# Patient Record
Sex: Female | Born: 1970 | Race: Black or African American | Hispanic: No | Marital: Married | Smoking: Never smoker
Health system: Southern US, Community
[De-identification: ages and names within clinical notes are randomized; demographics above are authoritative.]

## PROBLEM LIST (undated history)

## (undated) DIAGNOSIS — K219 Gastro-esophageal reflux disease without esophagitis: Secondary | ICD-10-CM

## (undated) DIAGNOSIS — I10 Essential (primary) hypertension: Secondary | ICD-10-CM

## (undated) HISTORY — PX: CERVICAL SPINE SURGERY: SHX589

---

## 2014-03-06 ENCOUNTER — Other Ambulatory Visit (HOSPITAL_BASED_OUTPATIENT_CLINIC_OR_DEPARTMENT_OTHER): Payer: Self-pay | Admitting: Emergency Medicine

## 2014-03-06 DIAGNOSIS — M5412 Radiculopathy, cervical region: Secondary | ICD-10-CM

## 2014-03-10 ENCOUNTER — Ambulatory Visit (HOSPITAL_BASED_OUTPATIENT_CLINIC_OR_DEPARTMENT_OTHER)
Admission: RE | Admit: 2014-03-10 | Discharge: 2014-03-10 | Disposition: A | Discharge status: Home / Self Care | Payer: 59 | Source: Ambulatory Visit | Attending: Emergency Medicine | Admitting: Emergency Medicine

## 2014-03-10 DIAGNOSIS — M542 Cervicalgia: Secondary | ICD-10-CM | POA: Diagnosis present

## 2014-03-10 DIAGNOSIS — M4802 Spinal stenosis, cervical region: Secondary | ICD-10-CM | POA: Diagnosis not present

## 2014-03-10 DIAGNOSIS — M5412 Radiculopathy, cervical region: Secondary | ICD-10-CM

## 2014-03-10 DIAGNOSIS — M5030 Other cervical disc degeneration, unspecified cervical region: Secondary | ICD-10-CM | POA: Insufficient documentation

## 2014-04-08 ENCOUNTER — Emergency Department (HOSPITAL_BASED_OUTPATIENT_CLINIC_OR_DEPARTMENT_OTHER)
Admission: EM | Admit: 2014-04-08 | Discharge: 2014-04-08 | Disposition: A | Discharge status: Home / Self Care | Payer: 59 | Attending: Emergency Medicine | Admitting: Emergency Medicine

## 2014-04-08 ENCOUNTER — Encounter (HOSPITAL_BASED_OUTPATIENT_CLINIC_OR_DEPARTMENT_OTHER): Payer: Self-pay | Admitting: Emergency Medicine

## 2014-04-08 DIAGNOSIS — R51 Headache: Secondary | ICD-10-CM | POA: Insufficient documentation

## 2014-04-08 DIAGNOSIS — J029 Acute pharyngitis, unspecified: Secondary | ICD-10-CM | POA: Diagnosis present

## 2014-04-08 DIAGNOSIS — Z791 Long term (current) use of non-steroidal anti-inflammatories (NSAID): Secondary | ICD-10-CM | POA: Diagnosis not present

## 2014-04-08 DIAGNOSIS — J039 Acute tonsillitis, unspecified: Secondary | ICD-10-CM | POA: Diagnosis not present

## 2014-04-08 DIAGNOSIS — Z79899 Other long term (current) drug therapy: Secondary | ICD-10-CM | POA: Insufficient documentation

## 2014-04-08 LAB — RAPID STREP SCREEN (MED CTR MEBANE ONLY): STREPTOCOCCUS, GROUP A SCREEN (DIRECT): NEGATIVE

## 2014-04-08 MED ORDER — DEXAMETHASONE 4 MG PO TABS
6.0000 mg | ORAL_TABLET | Freq: Once | ORAL | Status: AC
  Administered 2014-04-08: 6 mg via ORAL

## 2014-04-08 MED ORDER — DEXAMETHASONE 4 MG PO TABS
ORAL_TABLET | ORAL | Status: AC
  Administered 2014-04-08: 6 mg via ORAL
  Filled 2014-04-08: qty 2

## 2014-04-08 MED ORDER — PENICILLIN V POTASSIUM 500 MG PO TABS: 500.0000 mg | ORAL_TABLET | Freq: Two times a day (BID) | ORAL | Status: AC

## 2014-04-08 NOTE — ED Notes (Signed)
Patient here with 1 week of sore throat and bilateral ear pain, patient had 2 days of amoxicillin left and took those with some relief now has fever and increased sore throat

## 2014-04-08 NOTE — ED Notes (Signed)
Rechecked pt b/p 155/114 

## 2014-04-08 NOTE — ED Provider Notes (Signed)
CSN: [id number]     Arrival date & time 04/08/14  1007 History   First MD Initiated Contact with Patient 04/08/14 1048     Chief Complaint  Patient presents with  . Sore Throat     (Consider location/radiation/quality/duration/timing/severity/associated sxs/prior Treatment) Patient is a 43 y.o. female presenting with pharyngitis. The history is provided by the patient and the spouse. No language interpreter was used.  Sore Throat This is a new problem. The current episode started 2 days ago. The problem occurs constantly. The problem has been gradually worsening. Associated symptoms include headaches. Pertinent negatives include no chest pain, no abdominal pain and no shortness of breath. Associated symptoms comments: Fever, chills, L ear pain, anorexia, nausea. The symptoms are aggravated by swallowing. Nothing relieves the symptoms. Treatments tried: 2 days f amoxicillin. The treatment provided mild relief.    No past medical history on file. No past surgical history on file. No family history on file. History  Substance Use Topics  . Smoking status: Not on file  . Smokeless tobacco: Not on file  . Alcohol Use: Not on file   OB History   No data available     Review of Systems  Constitutional: Negative for fever, chills, diaphoresis, activity change, appetite change and fatigue.  HENT: Positive for sore throat. Negative for congestion, facial swelling and rhinorrhea.   Eyes: Negative for photophobia and discharge.  Respiratory: Negative for cough, chest tightness and shortness of breath.   Cardiovascular: Negative for chest pain, palpitations and leg swelling.  Gastrointestinal: Negative for nausea, vomiting, abdominal pain and diarrhea.  Endocrine: Negative for polydipsia and polyuria.  Genitourinary: Negative for dysuria, frequency, difficulty urinating and pelvic pain.  Musculoskeletal: Negative for arthralgias, back pain, neck pain and neck stiffness.  Skin: Negative for  color change and wound.  Allergic/Immunologic: Negative for immunocompromised state.  Neurological: Positive for headaches. Negative for facial asymmetry, weakness and numbness.  Hematological: Does not bruise/bleed easily.  Psychiatric/Behavioral: Negative for confusion and agitation.      Allergies  Review of patient's allergies indicates no known allergies.  Home Medications   Prior to Admission medications   Medication Sig Start Date End Date Taking? Authorizing Provider  labetalol (NORMODYNE) 100 MG tablet Take 100 mg by mouth 2 (two) times daily.   Yes Historical Provider, MD  lisinopril (PRINIVIL,ZESTRIL) 20 MG tablet Take 20 mg by mouth daily.   Yes Historical Provider, MD  meloxicam (MOBIC) 7.5 MG tablet Take 15 mg by mouth daily.   Yes Historical Provider, MD  penicillin v potassium (VEETID) 500 MG tablet Take 1 tablet (500 mg total) by mouth 2 (two) times daily. For 10 days 04/08/14 04/15/14  Toy Cookey, MD   BP 155/113  Pulse 80  Temp(Src) 99.2 F (37.3 C) (Oral)  Resp 20  SpO2 100% Physical Exam  Constitutional: She is oriented to person, place, and time. She appears well-developed and well-nourished. No distress.  HENT:  Head: Normocephalic and atraumatic.  Mouth/Throat: No oropharyngeal exudate or tonsillar abscesses.  Bilateral tonsillar enlargement and exudate,  Eyes: Pupils are equal, round, and reactive to light.  Neck: Normal range of motion. Neck supple.  Tender anterior cervical lymphadenopathy  Cardiovascular: Normal rate, regular rhythm and normal heart sounds.  Exam reveals no gallop and no friction rub.   No murmur heard. Pulmonary/Chest: Effort normal and breath sounds normal. No respiratory distress. She has no wheezes. She has no rales.  Abdominal: Soft. Bowel sounds are normal. She exhibits no distension and  no mass. There is no tenderness. There is no rebound and no guarding.  Musculoskeletal: Normal range of motion. She exhibits no edema and no  tenderness.  Neurological: She is alert and oriented to person, place, and time.  Skin: Skin is warm and dry.  Psychiatric: She has a normal mood and affect.    ED Course  Procedures (including critical care time) Labs Review Labs Reviewed  RAPID STREP SCREEN  CULTURE, GROUP A STREP    Imaging Review No results found.   EKG Interpretation None      MDM   Final diagnoses:  Tonsillitis with exudate    Pt is a 43 y.o. female with Pmhx as above who presents with  several days of sore throat, fevers, chills cold left ear pain, anorexia, and mild nausea. The patient reports a positive sick contact. She states she took several left over amoxicillin which seemed to improve her symptoms for a couple days. She states the left-sided return is worse than the right. On physical exam: Patient is in no acute distress. She has bilateral tonsillar edema with bilateral tonsillar exudates. No signs of PTA or retropharyngeal abscess. The pulmonary exam is normal. TMs are clear. Using center criteria she receives points for tonsillar exudates, fever, lack of cough, and tender anterior cervical lymphadenopathy. We'll treat her with 10 days of Pen-Vee K. Otherwise recommend supportive care. Return precautions given for new or worsening symptoms including worsening pain, worsening swelling, inability to tolerate by mouth.        Toy Cookey, MD 04/08/14 [phone]

## 2014-04-08 NOTE — Discharge Instructions (Signed)

## 2014-04-10 LAB — CULTURE, GROUP A STREP

## 2014-05-21 ENCOUNTER — Emergency Department (HOSPITAL_BASED_OUTPATIENT_CLINIC_OR_DEPARTMENT_OTHER)
Admission: EM | Admit: 2014-05-21 | Discharge: 2014-05-22 | Disposition: A | Discharge status: Home / Self Care | Payer: 59 | Attending: Emergency Medicine | Admitting: Emergency Medicine

## 2014-05-21 ENCOUNTER — Encounter (HOSPITAL_BASED_OUTPATIENT_CLINIC_OR_DEPARTMENT_OTHER): Payer: Self-pay | Admitting: Emergency Medicine

## 2014-05-21 ENCOUNTER — Emergency Department (HOSPITAL_BASED_OUTPATIENT_CLINIC_OR_DEPARTMENT_OTHER): Payer: 59

## 2014-05-21 DIAGNOSIS — K219 Gastro-esophageal reflux disease without esophagitis: Secondary | ICD-10-CM | POA: Insufficient documentation

## 2014-05-21 DIAGNOSIS — O99611 Diseases of the digestive system complicating pregnancy, first trimester: Secondary | ICD-10-CM | POA: Insufficient documentation

## 2014-05-21 DIAGNOSIS — O10011 Pre-existing essential hypertension complicating pregnancy, first trimester: Secondary | ICD-10-CM | POA: Diagnosis not present

## 2014-05-21 DIAGNOSIS — O039 Complete or unspecified spontaneous abortion without complication: Secondary | ICD-10-CM | POA: Insufficient documentation

## 2014-05-21 DIAGNOSIS — O209 Hemorrhage in early pregnancy, unspecified: Secondary | ICD-10-CM

## 2014-05-21 DIAGNOSIS — Z791 Long term (current) use of non-steroidal anti-inflammatories (NSAID): Secondary | ICD-10-CM | POA: Diagnosis not present

## 2014-05-21 DIAGNOSIS — Z3A01 Less than 8 weeks gestation of pregnancy: Secondary | ICD-10-CM | POA: Diagnosis not present

## 2014-05-21 DIAGNOSIS — O9989 Other specified diseases and conditions complicating pregnancy, childbirth and the puerperium: Secondary | ICD-10-CM | POA: Diagnosis present

## 2014-05-21 DIAGNOSIS — O26851 Spotting complicating pregnancy, first trimester: Secondary | ICD-10-CM

## 2014-05-21 DIAGNOSIS — Z79899 Other long term (current) drug therapy: Secondary | ICD-10-CM | POA: Insufficient documentation

## 2014-05-21 HISTORY — DX: Essential (primary) hypertension: I10

## 2014-05-21 HISTORY — DX: Gastro-esophageal reflux disease without esophagitis: K21.9

## 2014-05-21 LAB — CBC WITH DIFFERENTIAL/PLATELET
Basophils Absolute: 0 10*3/uL (ref 0.0–0.1)
Basophils Relative: 0 % (ref 0–1)
EOS PCT: 1 % (ref 0–5)
Eosinophils Absolute: 0.1 10*3/uL (ref 0.0–0.7)
HEMATOCRIT: 35.2 % — AB (ref 36.0–46.0)
HEMOGLOBIN: 12 g/dL (ref 12.0–15.0)
LYMPHS ABS: 2.2 10*3/uL (ref 0.7–4.0)
LYMPHS PCT: 31 % (ref 12–46)
MCH: 27.9 pg (ref 26.0–34.0)
MCHC: 34.1 g/dL (ref 30.0–36.0)
MCV: 81.9 fL (ref 78.0–100.0)
MONO ABS: 1 10*3/uL (ref 0.1–1.0)
Monocytes Relative: 14 % — ABNORMAL HIGH (ref 3–12)
NEUTROS ABS: 3.8 10*3/uL (ref 1.7–7.7)
Neutrophils Relative %: 54 % (ref 43–77)
Platelets: 233 10*3/uL (ref 150–400)
RBC: 4.3 MIL/uL (ref 3.87–5.11)
RDW: 15.8 % — ABNORMAL HIGH (ref 11.5–15.5)
WBC: 7.1 10*3/uL (ref 4.0–10.5)

## 2014-05-21 LAB — WET PREP, GENITAL
TRICH WET PREP: NONE SEEN
YEAST WET PREP: NONE SEEN

## 2014-05-21 LAB — BASIC METABOLIC PANEL
Anion gap: 12 (ref 5–15)
BUN: 13 mg/dL (ref 6–23)
CO2: 27 meq/L (ref 19–32)
CREATININE: 0.9 mg/dL (ref 0.50–1.10)
Calcium: 9.5 mg/dL (ref 8.4–10.5)
Chloride: 101 mEq/L (ref 96–112)
GFR calc Af Amer: 89 mL/min — ABNORMAL LOW (ref >90)
GFR calc non Af Amer: 77 mL/min — ABNORMAL LOW (ref >90)
GLUCOSE: 97 mg/dL (ref 70–99)
Potassium: 4.1 mEq/L (ref 3.7–5.3)
Sodium: 140 mEq/L (ref 137–147)

## 2014-05-21 LAB — URINALYSIS, ROUTINE W REFLEX MICROSCOPIC
BILIRUBIN URINE: NEGATIVE
Glucose, UA: NEGATIVE mg/dL
KETONES UR: 15 mg/dL — AB
Nitrite: NEGATIVE
PH: 5 (ref 5.0–8.0)
Protein, ur: NEGATIVE mg/dL
Specific Gravity, Urine: 1.024 (ref 1.005–1.030)
UROBILINOGEN UA: 0.2 mg/dL (ref 0.0–1.0)

## 2014-05-21 LAB — PREGNANCY, URINE: Preg Test, Ur: POSITIVE — AB

## 2014-05-21 LAB — URINE MICROSCOPIC-ADD ON

## 2014-05-21 LAB — HCG, QUANTITATIVE, PREGNANCY: hCG, Beta Chain, Quant, S: 7265 m[IU]/mL — ABNORMAL HIGH (ref <5)

## 2014-05-21 NOTE — ED Notes (Signed)
Pt is [redacted] weeks pregnant and began spotting last week. She began having lower abdominal pains yesterday that resolved and then returned. This is pt's 4th pregnancy.

## 2014-05-21 NOTE — ED Notes (Signed)
Patient transported to CT 

## 2014-05-21 NOTE — ED Notes (Signed)
np at bedside

## 2014-05-21 NOTE — ED Provider Notes (Signed)
CSN: [id number]     Arrival date & time 05/21/14  2043 History   First MD Initiated Contact with Patient 05/21/14 2118     Chief Complaint  Patient presents with  . Abdominal Pain     (Consider location/radiation/quality/duration/timing/severity/associated sxs/prior Treatment) HPI Comments: Pt comes in today with lower abdominal cramping and bleeding. Pt states that she is [redacted] weeks pregnant. Pt state that she G4p2 with one elective abortion. Pt has been seen by ob but they told her they didn't see a heart beat but that she was going to have a repeat us at 8 week. She states that she has had brown discharge for a week and today it turned red with it progressively getting heavier. Unsure of blood type  The history is provided by the patient. No language interpreter was used.    Past Medical History  Diagnosis Date  . Hypertension   . GERD (gastroesophageal reflux disease)    History reviewed. No pertinent past surgical history. No family history on file. History  Substance Use Topics  . Smoking status: Never Smoker   . Smokeless tobacco: Not on file  . Alcohol Use: Yes   OB History   Grav Para Term Preterm Abortions TAB SAB Ect Mult Living   1              Review of Systems  All other systems reviewed and are negative.     Allergies  Review of patient's allergies indicates no known allergies.  Home Medications   Prior to Admission medications   Medication Sig Start Date End Date Taking? Authorizing Provider  omeprazole (PRILOSEC) 40 MG capsule Take 40 mg by mouth daily.   Yes Historical Provider, MD  labetalol (NORMODYNE) 100 MG tablet Take 100 mg by mouth 2 (two) times daily.    Historical Provider, MD  lisinopril (PRINIVIL,ZESTRIL) 20 MG tablet Take 20 mg by mouth daily.    Historical Provider, MD  meloxicam (MOBIC) 7.5 MG tablet Take 15 mg by mouth daily.    Historical Provider, MD   BP 130/90  Pulse 110  Temp(Src) 98.6 F (37 C) (Oral)  Resp 18  Ht 5\' 3"  (1.6  m)  Wt 155 lb (70.308 kg)  BMI 27.46 kg/m2  SpO2 100% Physical Exam  Nursing note and vitals reviewed. Constitutional: She is oriented to person, place, and time. She appears well-developed and well-nourished.  Cardiovascular: Normal rate and regular rhythm.   Pulmonary/Chest: Effort normal and breath sounds normal.  Abdominal: Soft. Bowel sounds are normal.  Generalized lower abdominal tenderness  Genitourinary:  Brown blood noted in vaginal vault  Musculoskeletal: Normal range of motion.  Neurological: She is alert and oriented to person, place, and time.  Skin: Skin is warm and dry.    ED Course  Procedures (including critical care time) Labs Review Labs Reviewed  WET PREP, GENITAL - Abnormal; Notable for the following:    Clue Cells Wet Prep HPF POC FEW (*)    WBC, Wet Prep HPF POC FEW (*)    All other components within normal limits  URINALYSIS, ROUTINE W REFLEX MICROSCOPIC - Abnormal; Notable for the following:    APPearance CLOUDY (*)    Hgb urine dipstick LARGE (*)    Ketones, ur 15 (*)    Leukocytes, UA SMALL (*)    All other components within normal limits  PREGNANCY, URINE - Abnormal; Notable for the following:    Preg Test, Ur POSITIVE (*)    All other components  within normal limits  URINE MICROSCOPIC-ADD ON - Abnormal; Notable for the following:    Squamous Epithelial / LPF FEW (*)    Bacteria, UA FEW (*)    All other components within normal limits  CBC WITH DIFFERENTIAL - Abnormal; Notable for the following:    HCT 35.2 (*)    RDW 15.8 (*)    Monocytes Relative 14 (*)    All other components within normal limits  BASIC METABOLIC PANEL - Abnormal; Notable for the following:    GFR calc non Af Amer 77 (*)    GFR calc Af Amer 89 (*)    All other components within normal limits  HCG, QUANTITATIVE, PREGNANCY - Abnormal; Notable for the following:    hCG, Beta Chain, Quant, S 7265 (*)    All other components within normal limits  GC/CHLAMYDIA PROBE AMP   ABO/RH    Imaging Review [place] Ob Comp Less 14 Wks  05/21/2014   CLINICAL DATA:  43 year old female with bleeding in early pregnancy. Initial encounter. Quantitative beta HCG 7,265. Estimated gestational age by LMP 8 weeks 6 days.  EXAM: OBSTETRIC <14 WK US AND TRANSVAGINAL OB US  TECHNIQUE: Both transabdominal and transvaginal ultrasound examinations were performed for complete evaluation of the gestation as well as the maternal uterus, adnexal regions, and pelvic cul-de-sac. Transvaginal technique was performed to assess early pregnancy.  COMPARISON:  None.  FINDINGS: Intrauterine gestational sac: Single  Yolk sac:  Visible  Embryo:  Visible  Cardiac Activity: Not detected  MSD:  1.98  mm   6 w   6  d  Maternal uterus/adnexae: No subchorionic hemorrhage or pelvic free fluid. Normal right ovary 2.9 x 1.6 x 2.8 cm. Slightly larger left ovary measuring 2.9 x 2.3 cm, may contain a corpus luteum cyst (image 35).  IMPRESSION: Findings are suspicious but not yet definitive for failed pregnancy. Recommend follow-up US in 10-14 days for definitive diagnosis. This recommendation follows SRU consensus guidelines: Diagnostic Criteria for Nonviable Pregnancy Early in the First Trimester. Malva Limes Engl J Med 2013; 409:8119-14; 369:1443-51.   Electronically Signed   By: Augusto GambleLee  Hall M.D.   On: 05/21/2014 23:07   [place] Ob Transvaginal  05/21/2014   CLINICAL DATA:  43 year old female with bleeding in early pregnancy. Initial encounter. Quantitative beta HCG 7,265. Estimated gestational age by LMP 8 weeks 6 days.  EXAM: OBSTETRIC <14 WK US AND TRANSVAGINAL OB US  TECHNIQUE: Both transabdominal and transvaginal ultrasound examinations were performed for complete evaluation of the gestation as well as the maternal uterus, adnexal regions, and pelvic cul-de-sac. Transvaginal technique was performed to assess early pregnancy.  COMPARISON:  None.  FINDINGS: Intrauterine gestational sac: Single  Yolk sac:  Visible  Embryo:  Visible  Cardiac Activity: Not  detected  MSD:  1.98  mm   6 w   6  d  Maternal uterus/adnexae: No subchorionic hemorrhage or pelvic free fluid. Normal right ovary 2.9 x 1.6 x 2.8 cm. Slightly larger left ovary measuring 2.9 x 2.3 cm, may contain a corpus luteum cyst (image 35).  IMPRESSION: Findings are suspicious but not yet definitive for failed pregnancy. Recommend follow-up US in 10-14 days for definitive diagnosis. This recommendation follows SRU consensus guidelines: Diagnostic Criteria for Nonviable Pregnancy Early in the First Trimester. Malva Limes Engl J Med 2013; 782:9562-13; 369:1443-51.   Electronically Signed   By: Augusto GambleLee  Hall M.D.   On: 05/21/2014 23:07     EKG Interpretation None      MDM   Final diagnoses:  Spontaneous abortion    Discussed likely spontaneous abortion. Pt B+. Pt instructed on follow up with ob. Pt verbalized understanding    Teressa Lower, NP 05/22/14 [phone]

## 2014-05-22 LAB — ABO/RH: ABO/RH(D): B POS

## 2014-05-22 LAB — GC/CHLAMYDIA PROBE AMP
CT Probe RNA: NEGATIVE
GC Probe RNA: NEGATIVE

## 2014-05-22 NOTE — Discharge Instructions (Signed)
Follow up with your doctor as discussed to have a repeat ultrasound and blood work in the next week Miscarriage A miscarriage is the loss of an unborn baby (fetus) before the 20th week of pregnancy. The cause is often unknown.  HOME CARE  You may need to stay in bed (bed rest), or you may be able to do light activity. Go about activity as told by your doctor.  Have help at home.  Write down how many pads you use each day. Write down how soaked they are.  Do not use tampons. Do not wash out your vagina (douche) or have sex (intercourse) until your doctor approves.  Only take medicine as told by your doctor.  Do not take aspirin.  Keep all doctor visits as told.  If you or your partner have problems with grieving, talk to your doctor. You can also try counseling. Give yourself time to grieve before trying to get pregnant again. GET HELP RIGHT AWAY IF:  You have bad cramps or pain in your back or belly (abdomen).  You have a fever.  You pass large clumps of blood (clots) from your vagina that are walnut-sized or larger. Save the clumps for your doctor to see.  You pass large amounts of tissue from your vagina. Save the tissue for your doctor to see.  You have more bleeding.  You have thick, bad-smelling fluid (discharge) coming from the vagina.  You get lightheaded, weak, or you pass out (faint).  You have chills. MAKE SURE YOU:  Understand these instructions.  Will watch your condition.  Will get help right away if you are not doing well or get worse. Document Released: 10/12/2011 Document Reviewed: 10/12/2011 [facility] Patient Information 2015 [employer], [place]. This information is not intended to replace advice given to you by your health care provider. Make sure you discuss any questions you have with your health care provider.

## 2014-05-23 NOTE — ED Provider Notes (Signed)
Medical screening examination/treatment/procedure(s) were performed by non-physician practitioner and as supervising physician I was immediately available for consultation/collaboration.   EKG Interpretation None        Liviya Santini, MD 05/23/14 0013 

## 2014-06-04 ENCOUNTER — Encounter (HOSPITAL_BASED_OUTPATIENT_CLINIC_OR_DEPARTMENT_OTHER): Payer: Self-pay | Admitting: Emergency Medicine

## 2015-06-08 IMAGING — US US OB TRANSVAGINAL
1 series · 14 of 28 positions shown · non-contrast
Comparison: None.

CLINICAL DATA: 43-year-old female with bleeding in early pregnancy.
Initial encounter. Quantitative beta HCG [DATE]. Estimated
gestational age by LMP 8 weeks 6 days.

EXAM:
OBSTETRIC <14 WK US AND TRANSVAGINAL OB US
TECHNIQUE: Both transabdominal and transvaginal ultrasound examinations were
performed for complete evaluation of the gestation as well as the
maternal uterus, adnexal regions, and pelvic cul-de-sac.
Transvaginal technique was performed to assess early pregnancy.

[Series 1: us ob transvaginal · 0.22mm/px · 38 acquisitions, 14 frames shown]
[im 2/38]
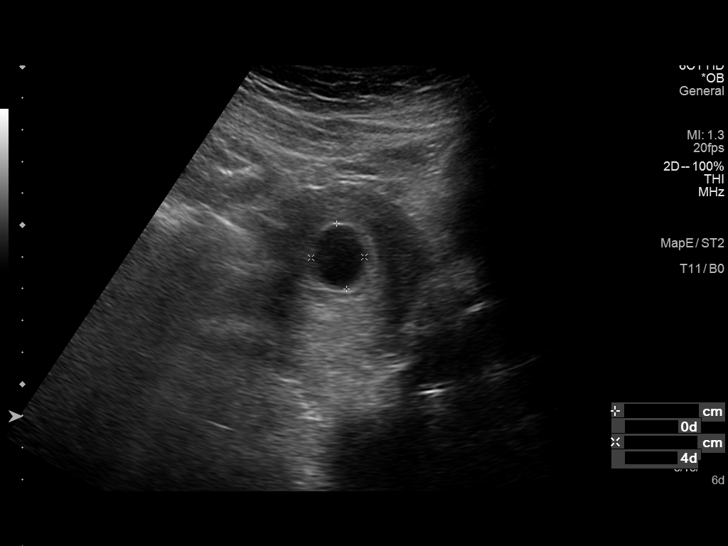
[im 5/38]
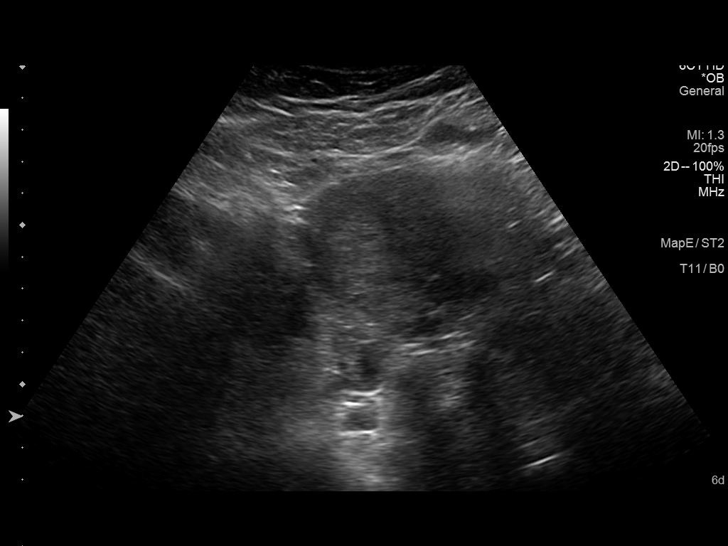
[im 7/38]
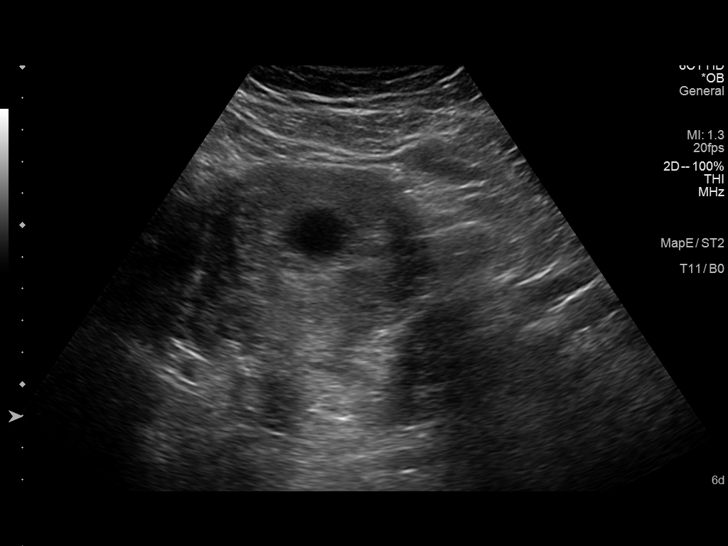
[im 10/38]
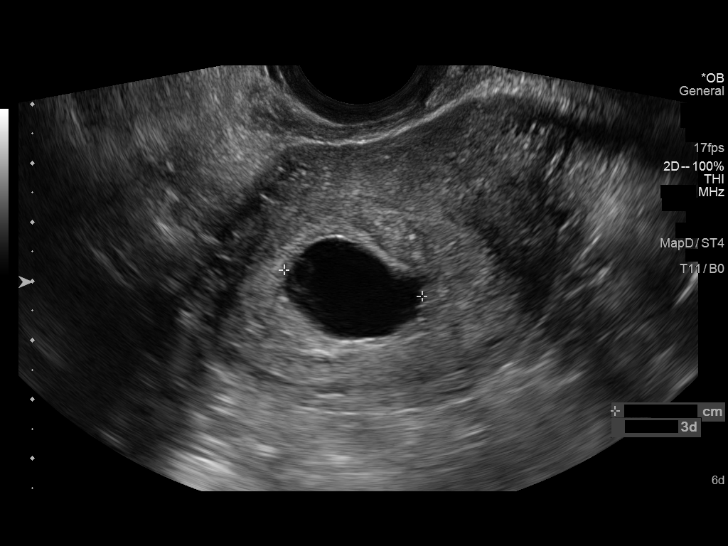
[im 13/38]
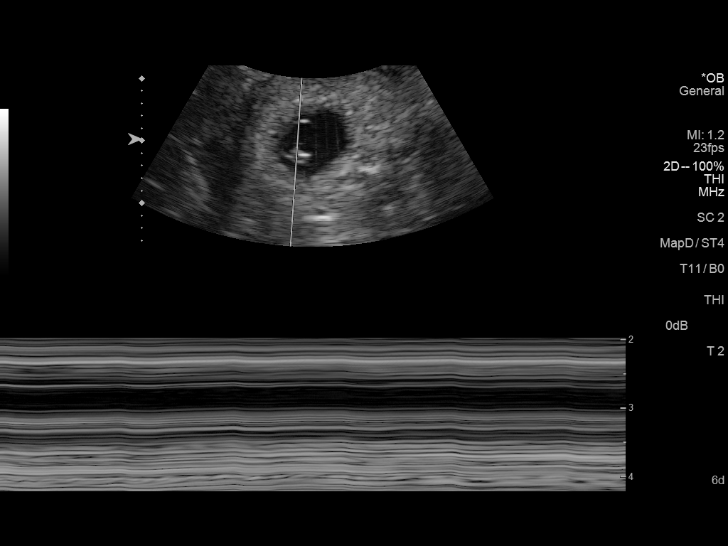
[im 16/38]
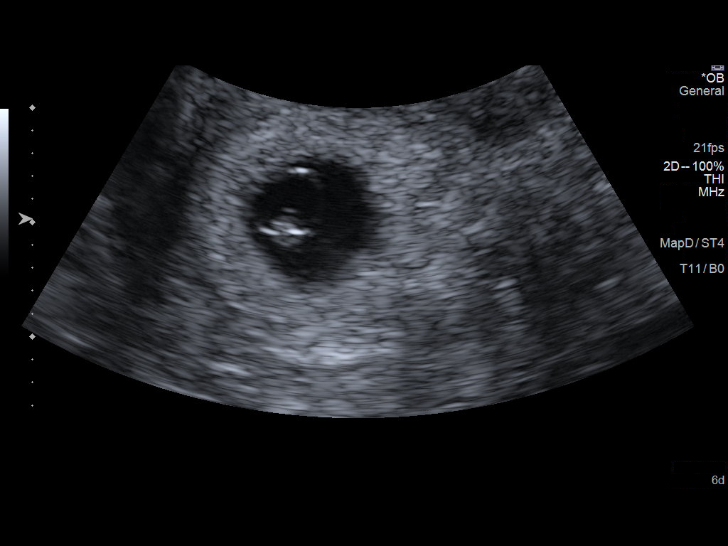
[im 18/38]
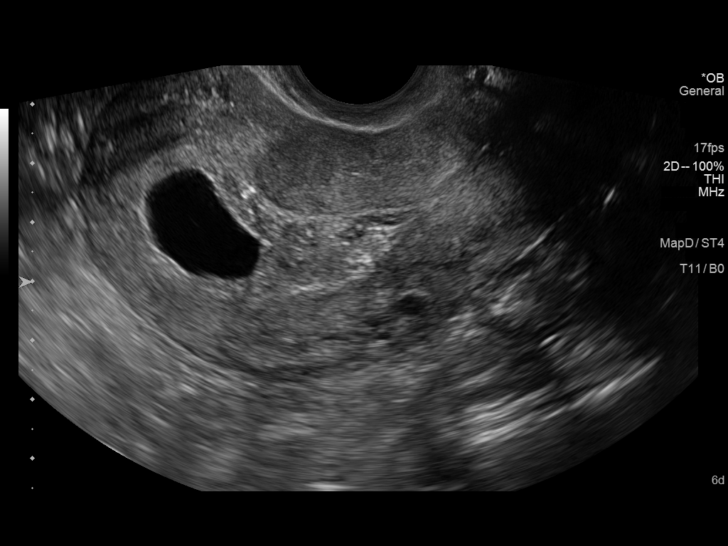
[im 21/38]
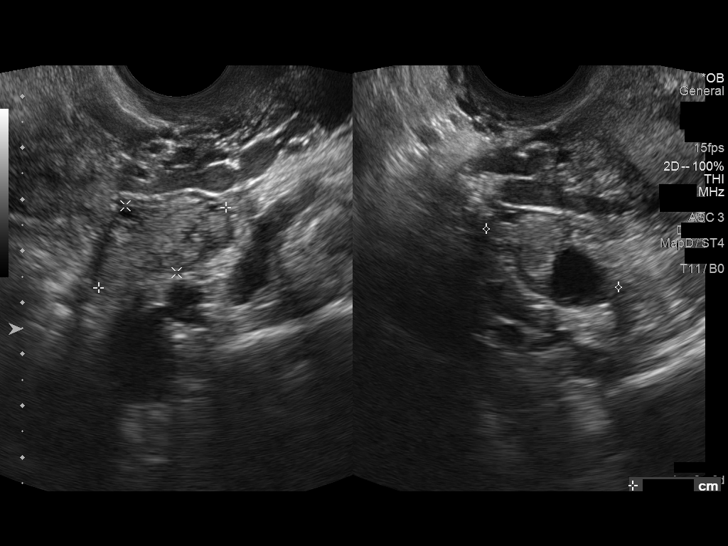
[im 24/38]
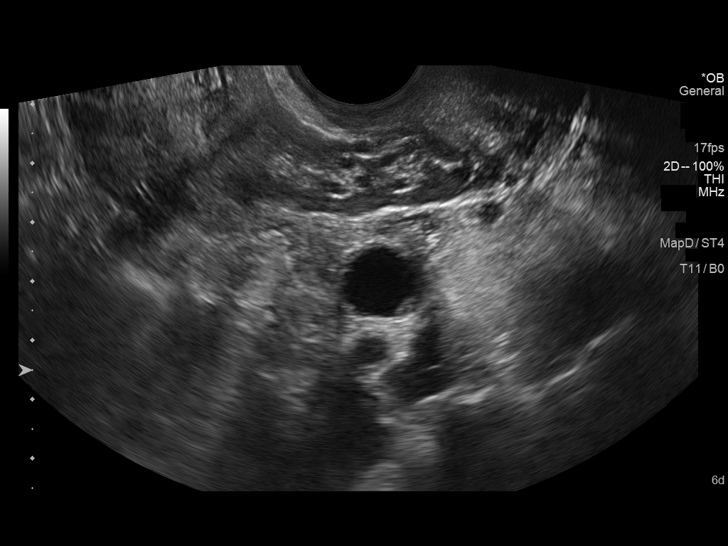
[im 27/38]
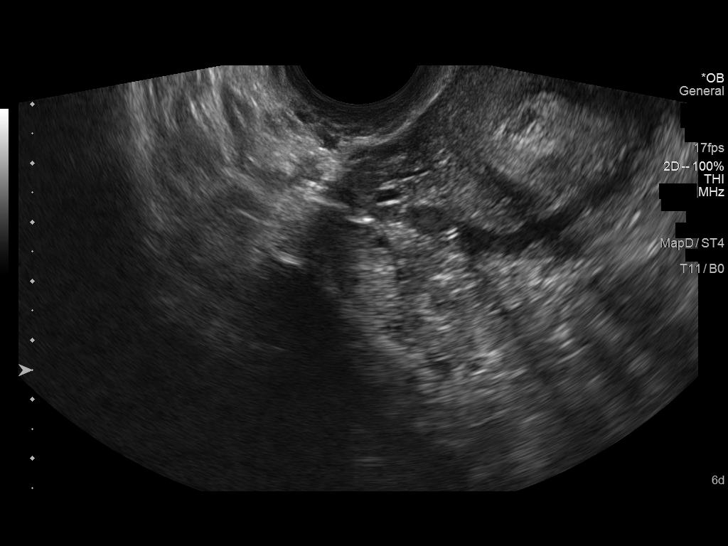
[im 29/38]
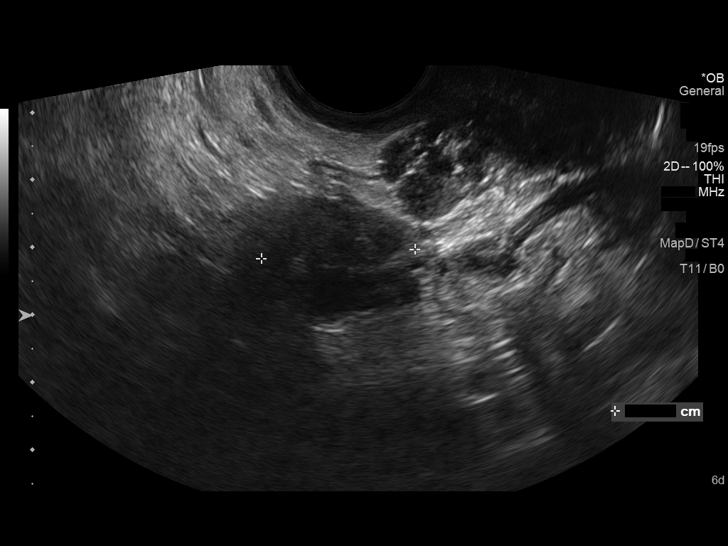
[im 32/38]
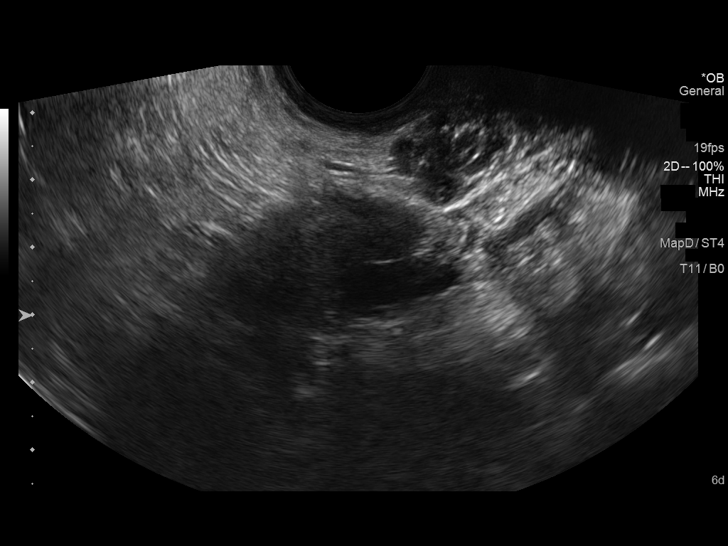
[im 35/38]
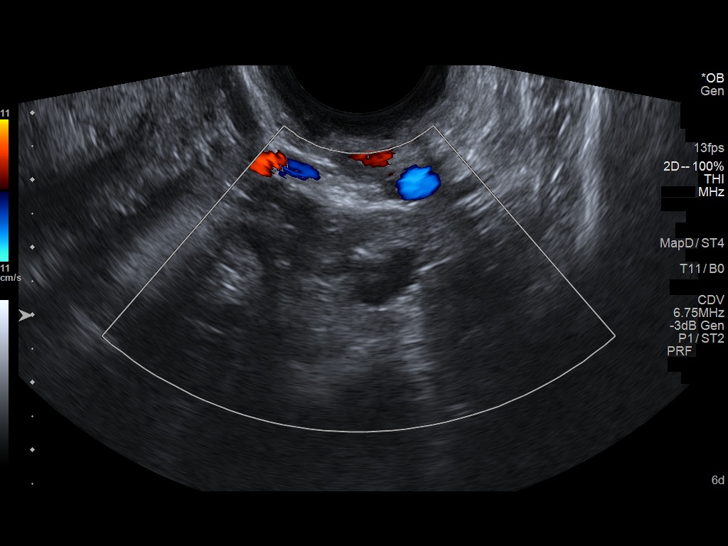
[im 38/38]
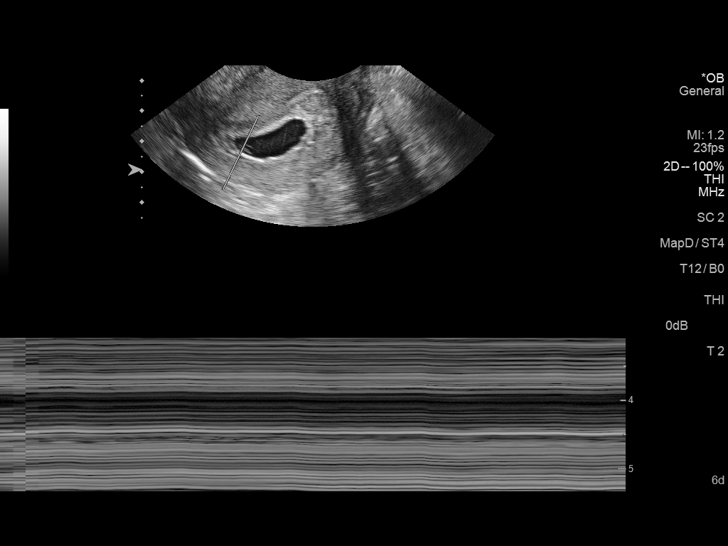

[14 of 28 positions shown; findings below may reference images not displayed]

FINDINGS: Intrauterine gestational sac: Single

Yolk sac:  Visible

Embryo:  Visible

Cardiac Activity: Not detected

MSD:  1.98  mm   6 w   6  d

Maternal uterus/adnexae: No subchorionic hemorrhage or pelvic free
fluid. Normal right ovary 2.9 x 1.6 x 2.8 cm. Slightly larger left
ovary measuring 2.9 x 2.3 cm, may contain a corpus luteum cyst
(image 35).
IMPRESSION: Findings are suspicious but not yet definitive for failed pregnancy.
Recommend follow-up US in 10-14 days for definitive diagnosis. This
recommendation follows SRU consensus guidelines: Diagnostic Criteria
for Nonviable Pregnancy Early in the First Trimester. N Engl J Med

## 2016-02-27 ENCOUNTER — Emergency Department (HOSPITAL_BASED_OUTPATIENT_CLINIC_OR_DEPARTMENT_OTHER)
Admission: EM | Admit: 2016-02-27 | Discharge: 2016-02-27 | Disposition: A | Discharge status: Home / Self Care | Attending: Emergency Medicine | Admitting: Emergency Medicine

## 2016-02-27 ENCOUNTER — Encounter (HOSPITAL_BASED_OUTPATIENT_CLINIC_OR_DEPARTMENT_OTHER): Payer: Self-pay | Admitting: *Deleted

## 2016-02-27 DIAGNOSIS — Z79899 Other long term (current) drug therapy: Secondary | ICD-10-CM | POA: Insufficient documentation

## 2016-02-27 DIAGNOSIS — I10 Essential (primary) hypertension: Secondary | ICD-10-CM | POA: Insufficient documentation

## 2016-02-27 DIAGNOSIS — J029 Acute pharyngitis, unspecified: Secondary | ICD-10-CM

## 2016-02-27 LAB — RAPID STREP SCREEN (MED CTR MEBANE ONLY): Streptococcus, Group A Screen (Direct): NEGATIVE

## 2016-02-27 MED ORDER — DEXAMETHASONE SODIUM PHOSPHATE 10 MG/ML IJ SOLN
10.0000 mg | Freq: Once | INTRAMUSCULAR | Status: AC
  Administered 2016-02-27: 10 mg via INTRAMUSCULAR
  Filled 2016-02-27: qty 1

## 2016-02-27 NOTE — Discharge Instructions (Signed)
You have been seen today for a sore throat and body aches. Your rapid strep test was negative, indicating strep throat is unlikely. Your symptoms are consistent with a viral illness. Viruses do not require antibiotics. Treatment is symptomatic care. Drink plenty of fluids and get plenty of rest. You should be drinking at least a liter of water an hour to stay hydrated. Ibuprofen, Naproxen, or Tylenol for pain or fever. Plain Mucinex may help relieve congestion. Warm liquids or Chloraseptic spray may help soothe the sore throat. Follow up with PCP as needed should symptoms fail to resolve. Return to ED should symptoms worsen.

## 2016-02-27 NOTE — ED Triage Notes (Signed)
Sore throat x 4 days. White drainage on her tonsils.

## 2016-02-27 NOTE — ED Provider Notes (Signed)
[facility]   CSN: [id number] Arrival date & time: 02/27/16  1234  First Provider Contact:  First MD Initiated Contact with Patient 02/27/16 1254        History   Chief Complaint Chief Complaint  Patient presents with  . Sore Throat    HPI Diamond Baker is a 45 y.o. female.  HPI   Diamond Baker is a 45 y.o. female, with a history of GERD and hypertension, presenting to the ED with Sore throat, body aches, and congestion for the last 4 days. Patient has been taking ibuprofen intermittently for symptoms. Throat pain is bilateral and moderate, sharp in nature, nonradiating. Patient denies fever/chills, difficulty breathing or swallowing, nausea or vomiting, or any other complaints.    Past Medical History:  Diagnosis Date  . GERD (gastroesophageal reflux disease)   . Hypertension     There are no active problems to display for this patient.   History reviewed. No pertinent surgical history.  OB History    Gravida Para Term Preterm AB Living   1             SAB TAB Ectopic Multiple Live Births                   Home Medications    Prior to Admission medications   Medication Sig Start Date End Date Taking? Authorizing Provider  labetalol (NORMODYNE) 100 MG tablet Take 100 mg by mouth 2 (two) times daily.    Historical Provider, MD  lisinopril (PRINIVIL,ZESTRIL) 20 MG tablet Take 20 mg by mouth daily.    Historical Provider, MD  meloxicam (MOBIC) 7.5 MG tablet Take 15 mg by mouth daily.    Historical Provider, MD  omeprazole (PRILOSEC) 40 MG capsule Take 40 mg by mouth daily.    Historical Provider, MD    Family History No family history on file.  Social History Social History  Substance Use Topics  . Smoking status: Never Smoker  . Smokeless tobacco: Never Used  . Alcohol use Yes     Allergies   Review of patient's allergies indicates no known allergies.   Review of Systems Review of Systems    Constitutional: Negative for chills and fever.  HENT: Positive for congestion and sore throat. Negative for drooling, facial swelling, trouble swallowing and voice change.   Respiratory: Negative for cough and shortness of breath.   Musculoskeletal: Positive for myalgias.  All other systems reviewed and are negative.    Physical Exam Updated Vital Signs BP (!) 145/112   Pulse 88   Temp 98.7 F (37.1 C) (Oral)   Resp 16   Ht 5\' 4"  (1.626 m)   Wt 70.3 kg   LMP 02/13/2016   SpO2 100%   Breastfeeding? Unknown   BMI 26.61 kg/m   Physical Exam  Constitutional: She appears well-developed and well-nourished. No distress.  HENT:  Head: Normocephalic and atraumatic.  Mouth/Throat: Mucous membranes are normal. Oropharyngeal exudate, posterior oropharyngeal edema and posterior oropharyngeal erythema present. No tonsillar abscesses.  Patient readily handles oral secretions without difficulty.  Eyes: Conjunctivae are normal.  Neck: Neck supple.  Cardiovascular: Normal rate, regular rhythm, normal heart sounds and intact distal pulses.   Pulmonary/Chest: Effort normal and breath sounds normal. No respiratory distress.  No increased work of breathing.  Abdominal: Soft. There is no tenderness. There is no guarding.  Musculoskeletal: She exhibits no edema or tenderness.  Lymphadenopathy:    She has cervical adenopathy.  Neurological:  She is alert.  Skin: Skin is warm and dry. She is not diaphoretic.  Psychiatric: She has a normal mood and affect. Her behavior is normal.  Nursing note and vitals reviewed.    ED Treatments / Results  Labs (all labs ordered are listed, but only abnormal results are displayed) Labs Reviewed  RAPID STREP SCREEN (NOT AT [facility])  CULTURE, GROUP A STREP [facility])    EKG  EKG Interpretation None       Radiology No results found.  Procedures Procedures (including critical care time)  Medications Ordered in ED Medications  dexamethasone (DECADRON)  injection 10 mg (10 mg Intramuscular Given 02/27/16 1328)     Initial Impression / Assessment and Plan / ED Course  I have reviewed the triage vital signs and the nursing notes.  Pertinent labs & imaging results that were available during my care of the patient were reviewed by me and considered in my medical decision making (see chart for details).  Clinical Course    Diamond Baker presents with sore throat, body aches, and congestion for the last 4 days.  Rapid strep negative. Patient's symptoms likely viral in origin. Hypertension noted. Patient has no signs of hypertensive emergency. The patient was given instructions for home care as well as return precautions. Patient voices understanding of these instructions, accepts the plan, and is comfortable with discharge.  Vitals:   02/27/16 1243 02/27/16 1326  BP: (!) 145/112 (!) 156/116  Pulse: 88 70  Resp: 16 20  Temp: 98.7 F (37.1 C)   TempSrc: Oral   SpO2: 100% 100%  Weight: 70.3 kg   Height:  (1.626 m)      Final Clinical Impressions(s) / ED Diagnoses   Final diagnoses:  Pharyngitis    New Prescriptions Discharge Medication List as of 02/27/2016  1:20 PM       Anselm Pancoast, PA-C 02/27/16 1550    Gwyneth Sprout, MD 02/28/16 [phone]

## 2016-03-01 LAB — CULTURE, GROUP A STREP (THRC)

## 2016-08-27 ENCOUNTER — Emergency Department
Admission: EM | Admit: 2016-08-27 | Discharge: 2016-08-27 | Discharge status: Absent without leave | Source: Home / Self Care

## 2016-08-27 ENCOUNTER — Ambulatory Visit (INDEPENDENT_AMBULATORY_CARE_PROVIDER_SITE_OTHER): Admitting: Obstetrics & Gynecology

## 2016-08-27 VITALS — BP 236/104 | Temp 98.3°F | Ht 64.0 in | Wt 153.0 lb

## 2016-08-27 DIAGNOSIS — N938 Other specified abnormal uterine and vaginal bleeding: Secondary | ICD-10-CM

## 2016-08-27 DIAGNOSIS — R102 Pelvic and perineal pain: Secondary | ICD-10-CM

## 2016-08-27 NOTE — Progress Notes (Signed)
   Subjective:    Patient ID: Diamond HearingKatina Clinton Baker, female    DOB: [birth year], 46 y.o.   MRN: [id number]  HPI 46 yo MAA P2 here with the complaint of several months of DUB with heavy bleeding and pelvic pain. [facility] but denies having an u/s or cbc/tsh.   Review of Systems She reports her pap smear to be UTD and normal. She has not used contraception for about 5 years and would be happy if she gets pregnant.    Objective:   Physical Exam [id number] Breathing, conversing, and ambulating normally Abd- benign Vulva- normal Bimanual exam reveals a RV uterus with a probable fibroid, no adnexal masses palpable       Assessment & Plan:  DUB/pelvic pain- order gyn u/s, check CBC and TSH

## 2016-08-28 LAB — CBC
HCT: 41.4 % (ref 35.0–45.0)
Hemoglobin: 13.3 g/dL (ref 11.7–15.5)
MCH: 27.8 pg (ref 27.0–33.0)
MCHC: 32.1 g/dL (ref 32.0–36.0)
MCV: 86.6 fL (ref 80.0–100.0)
MPV: 11.1 fL (ref 7.5–12.5)
Platelets: 185 10*3/uL (ref 140–400)
RBC: 4.78 MIL/uL (ref 3.80–5.10)
RDW: 14.4 % (ref 11.0–15.0)
WBC: 5.3 10*3/uL (ref 3.8–10.8)

## 2016-08-28 LAB — TSH: TSH: 0.74 mIU/L

## 2018-05-02 ENCOUNTER — Other Ambulatory Visit: Payer: Self-pay

## 2018-05-02 ENCOUNTER — Encounter (HOSPITAL_BASED_OUTPATIENT_CLINIC_OR_DEPARTMENT_OTHER): Payer: Self-pay | Admitting: *Deleted

## 2018-05-02 ENCOUNTER — Emergency Department (HOSPITAL_BASED_OUTPATIENT_CLINIC_OR_DEPARTMENT_OTHER)
Admission: EM | Admit: 2018-05-02 | Discharge: 2018-05-02 | Disposition: A | Discharge status: Home / Self Care | Attending: Emergency Medicine | Admitting: Emergency Medicine

## 2018-05-02 ENCOUNTER — Emergency Department (HOSPITAL_BASED_OUTPATIENT_CLINIC_OR_DEPARTMENT_OTHER)

## 2018-05-02 DIAGNOSIS — R079 Chest pain, unspecified: Secondary | ICD-10-CM | POA: Diagnosis not present

## 2018-05-02 LAB — BASIC METABOLIC PANEL
Anion gap: 9 (ref 5–15)
BUN: 15 mg/dL (ref 6–20)
CHLORIDE: 104 mmol/L (ref 98–111)
CO2: 27 mmol/L (ref 22–32)
Calcium: 8.9 mg/dL (ref 8.9–10.3)
Creatinine, Ser: 0.82 mg/dL (ref 0.44–1.00)
GFR calc Af Amer: >60 mL/min (ref >60)
GFR calc non Af Amer: >60 mL/min (ref >60)
GLUCOSE: 98 mg/dL (ref 70–99)
Potassium: 3.4 mmol/L — ABNORMAL LOW (ref 3.5–5.1)
Sodium: 140 mmol/L (ref 135–145)

## 2018-05-02 LAB — CBC
HCT: 36.4 % (ref 36.0–46.0)
HEMOGLOBIN: 12.5 g/dL (ref 12.0–15.0)
MCH: 28 pg (ref 26.0–34.0)
MCHC: 34.3 g/dL (ref 30.0–36.0)
MCV: 81.4 fL (ref 78.0–100.0)
Platelets: 202 10*3/uL (ref 150–400)
RBC: 4.47 MIL/uL (ref 3.87–5.11)
RDW: 14.3 % (ref 11.5–15.5)
WBC: 4.8 10*3/uL (ref 4.0–10.5)

## 2018-05-02 LAB — TROPONIN I: Troponin I: <0.03 ng/mL (ref <0.03)

## 2018-05-02 MED ORDER — NAPROXEN 500 MG PO TABS: 500.0000 mg | ORAL_TABLET | Freq: Two times a day (BID) | ORAL | 0 refills | Status: DC

## 2018-05-02 NOTE — Discharge Instructions (Addendum)
You were evaluated in the Emergency Department and after careful evaluation, we did not find any emergent condition requiring admission or further testing in the hospital.  Your symptoms today seem to be due to costochondritis.  Please return to the Emergency Department if you experience any worsening of your condition.  We encourage you to follow up with a primary care provider.  Thank you for allowing [place] to be a part of your care.

## 2018-05-02 NOTE — ED Provider Notes (Signed)
[facility] Emergency Department Provider Note MRN:  [id number]  Arrival date & time: 05/02/18     Chief Complaint   Chest Pain   History of Present Illness   Diamond Baker is a 47 y.o. year-old female with a history of GERD, hypertension presenting to the ED with chief complaint of chest pain.  Pain is located in the right chest, constant, began this morning at 5 AM.  Described as a sharp pain, worse with motion.  Denies dizziness, no nausea, no vomiting, no shortness of breath.  Did not improve with home Tums.  Review of Systems  A complete 10 system review of systems was obtained and all systems are negative except as noted in the HPI and PMH.   Patient's Health History    Past Medical History:  Diagnosis Date  . GERD (gastroesophageal reflux disease)   . Hypertension     History reviewed. No pertinent surgical history.  History reviewed. No pertinent family history.  Social History   Socioeconomic History  . Marital status: Single    Spouse name: Not on file  . Number of children: Not on file  . Years of education: Not on file  . Highest education level: Not on file  Occupational History  . Not on file  Social Needs  . Financial resource strain: Not on file  . Food insecurity:    Worry: Not on file    Inability: Not on file  . Transportation needs:    Medical: Not on file    Non-medical: Not on file  Tobacco Use  . Smoking status: Never Smoker  . Smokeless tobacco: Never Used  Substance and Sexual Activity  . Alcohol use: Yes  . Drug use: No  . Sexual activity: Not on file  Lifestyle  . Physical activity:    Days per week: Not on file    Minutes per session: Not on file  . Stress: Not on file  Relationships  . Social connections:    Talks on phone: Not on file    Gets together: Not on file    Attends religious service: Not on file    Active member of club or organization: Not on file    Attends meetings of clubs or  organizations: Not on file    Relationship status: Not on file  . Intimate partner violence:    Fear of current or ex partner: Not on file    Emotionally abused: Not on file    Physically abused: Not on file    Forced sexual activity: Not on file  Other Topics Concern  . Not on file  Social History Narrative  . Not on file     Physical Exam  Vital Signs and Nursing Notes reviewed Vitals:   05/02/18 1428 05/02/18 1433  BP: (!) 142/105 (!) 142/109  Pulse: 71 68  Resp: 18 16  Temp:    SpO2: 100% 100%    CONSTITUTIONAL: Well-appearing, NAD NEURO:  Alert and oriented x 3, no focal deficits EYES:  eyes equal and reactive ENT/NECK:  no LAD, no JVD CARDIO: Regular rate, well-perfused, normal S1 and S2, mild tenderness palpation to the right chest PULM:  CTAB no wheezing or rhonchi GI/GU:  normal bowel sounds, non-distended, non-tender MSK/SPINE:  No gross deformities, no edema SKIN:  no rash, atraumatic PSYCH:  Appropriate speech and behavior  Diagnostic and Interventional Summary    EKG Interpretation  Date/Time:  Monday May 02 2018 12:49:58 EDT Ventricular Rate:  79 PR Interval:    QRS Duration: 84 QT Interval:  399 QTC Calculation: 458 R Axis:   49 Text Interpretation:  Sinus rhythm Probable left atrial enlargement Baseline wander in lead(s) V4 Confirmed by Kennis Carina [phone]) on 05/02/2018 2:58:31 PM      Labs Reviewed  BASIC METABOLIC PANEL - Abnormal; Notable for the following components:      Result Value   Potassium 3.4 (*)    All other components within normal limits  CBC  TROPONIN I    DG Chest 2 View  Final Result      Medications - No data to display   Procedures Critical Care  ED Course and Medical Decision Making  I have reviewed the triage vital signs and the nursing notes.  Pertinent labs & imaging results that were available during my care of the patient were reviewed by me and considered in my medical decision making (see below for  details).    Favoring MSK pain is the cause of this 47 year old female with chest pain, little to no cardiac risk factors, not tachycardic, satting well on room air, no evidence of DVT on exam, nothing to suggest PE today.  Will screen with chest x-ray, troponin, reassess.  EKG not concerning, trop negative.  Provided reassurance, will fu with PCP.    After the discussed management above, the patient was determined to be safe for discharge.  The patient was in agreement with this plan and all questions regarding their care were answered.  ED return precautions were discussed and the patient will return to the ED with any significant worsening of condition.  Elmer Sow. Pilar Plate, MD [facility] Emergency Medicine [facility] [email or web address]  Final Clinical Impressions(s) / ED Diagnoses     ICD-10-CM   1. Chest pain R07.9 DG Chest 2 View    DG Chest 2 View    ED Discharge Orders         Ordered    naproxen (NAPROSYN) 500 MG tablet  2 times daily     05/02/18 1514             Sabas Sous, MD 05/02/18 1722

## 2018-05-02 NOTE — ED Triage Notes (Signed)
Pt reports chest pain x this am, she noticed it after she woke up this am. "feels like gas" pt states she has taken zantac and gas mediciation x 3 this am with no relief "it kept getting worse." reports feeling sob "when it hits". Pain is not present at this time, but comes and goes, describes as "sharp, shooting". ekg performed at triage and shown to edp by janet, emt.

## 2019-05-20 IMAGING — CR DG CHEST 2V
2 series · 2 of 2 positions shown · non-contrast
Comparison: February 14, 2015

CLINICAL DATA: Chest pain and shortness of breath

EXAM:
CHEST - 2 VIEW

[w chest pa]
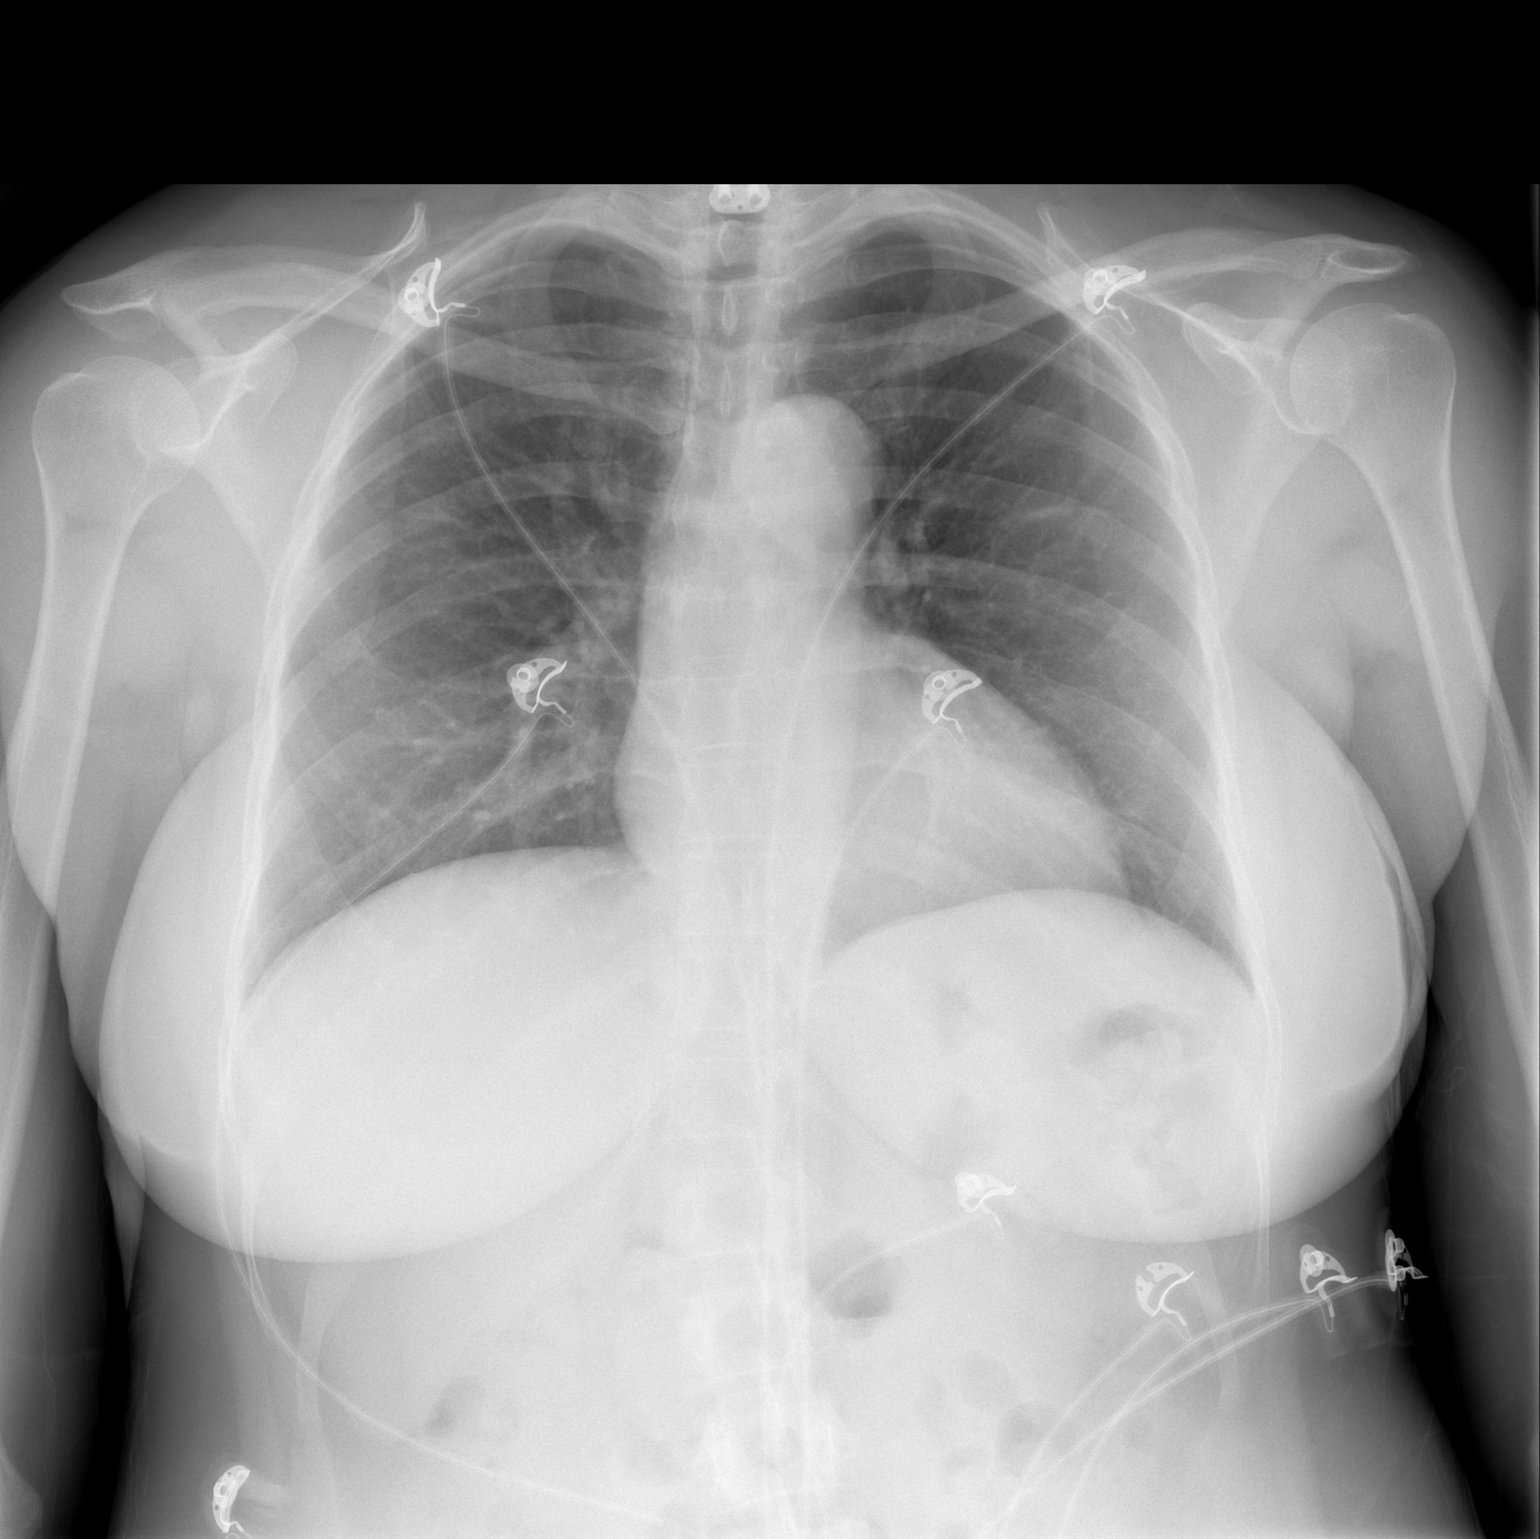

[w chest lat]
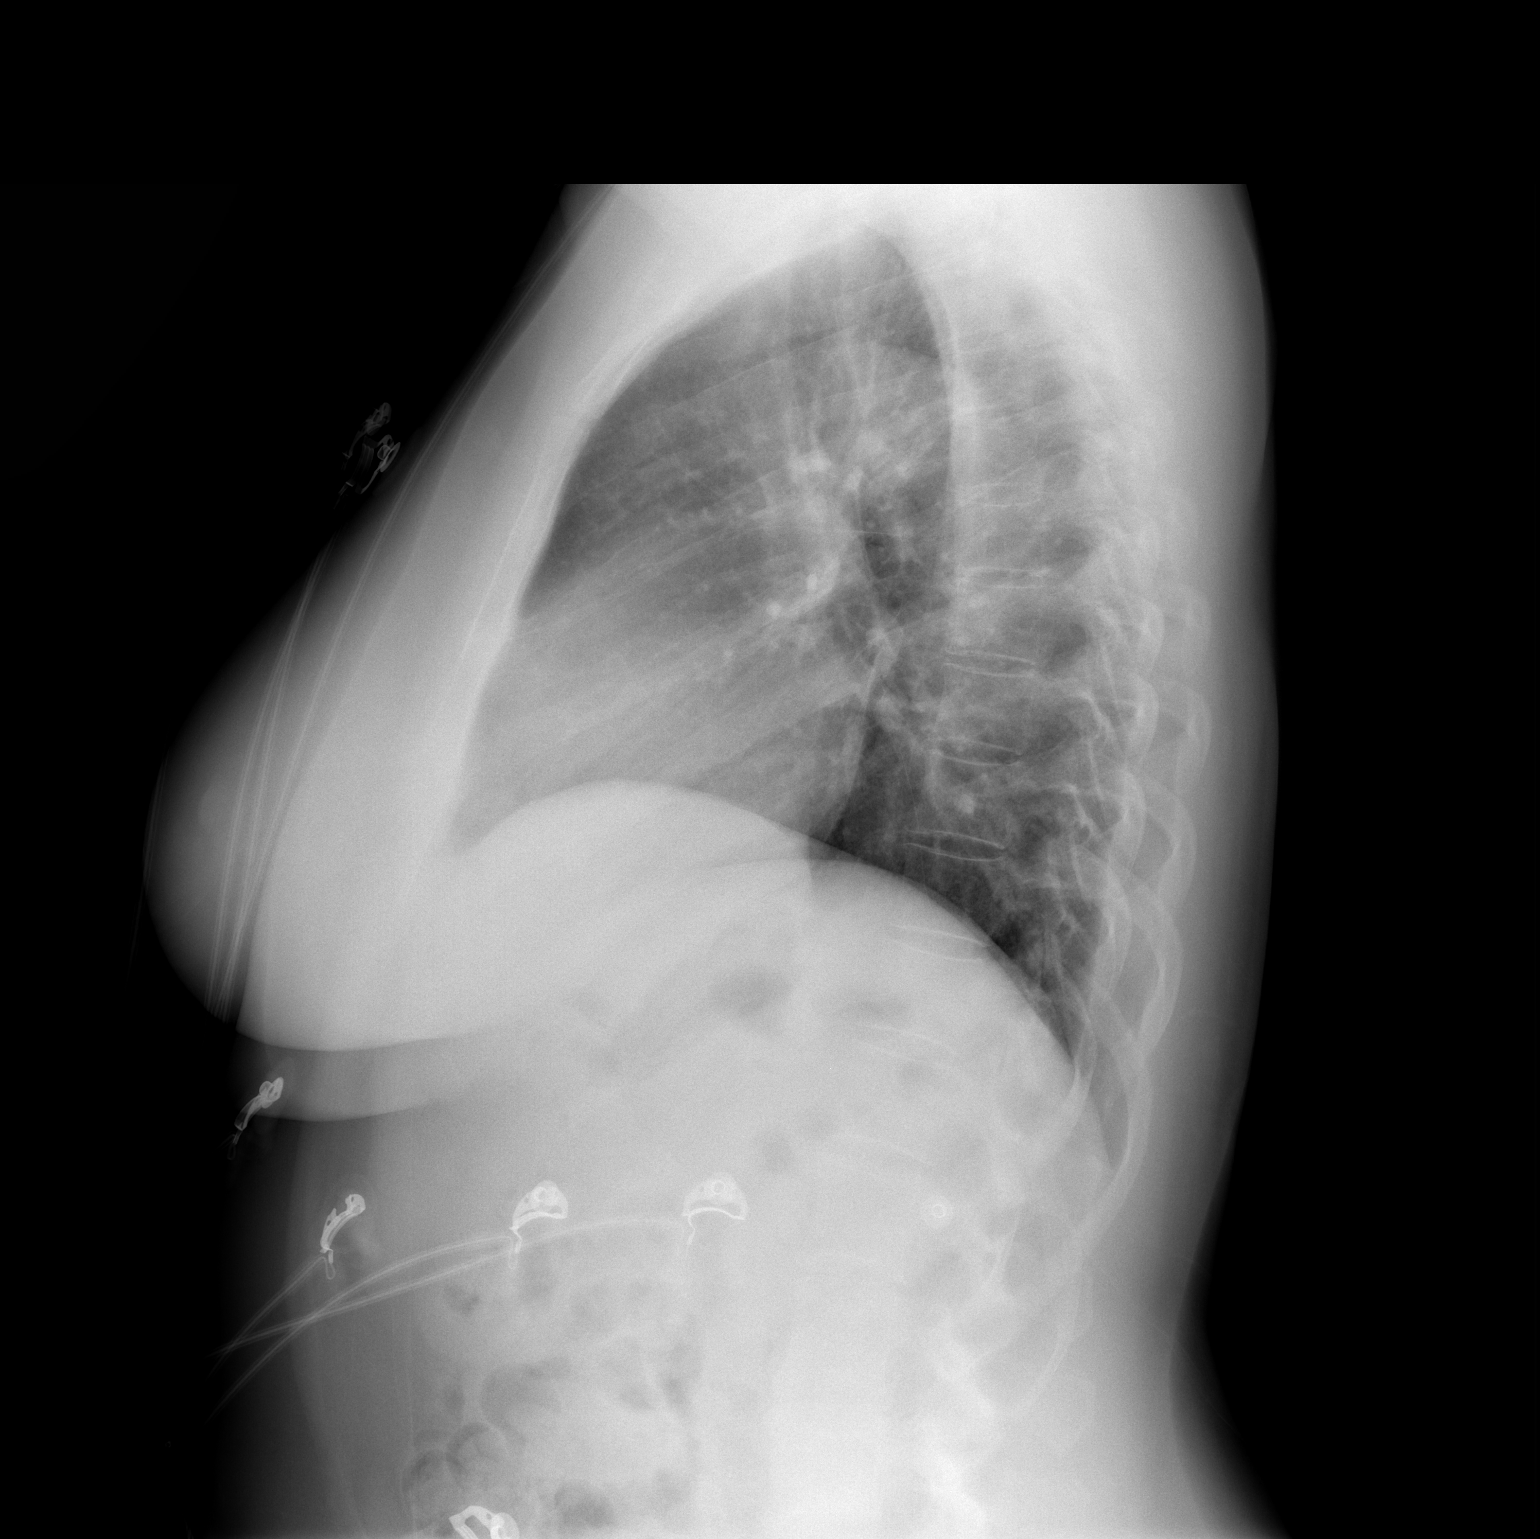

[2 of 2 positions shown; findings below may reference images not displayed]

FINDINGS: Lungs are clear. Heart size and pulmonary vascularity are normal. No
adenopathy. There is postoperative change in the lower cervical
region. No pneumothorax.
IMPRESSION: No edema or consolidation.

## 2019-08-04 ENCOUNTER — Other Ambulatory Visit: Payer: Self-pay

## 2019-08-04 ENCOUNTER — Emergency Department (INDEPENDENT_AMBULATORY_CARE_PROVIDER_SITE_OTHER)
Admission: EM | Admit: 2019-08-04 | Discharge: 2019-08-04 | Disposition: A | Discharge status: Home / Self Care | Source: Home / Self Care | Attending: Family Medicine | Admitting: Family Medicine

## 2019-08-04 ENCOUNTER — Encounter: Payer: Self-pay | Admitting: Emergency Medicine

## 2019-08-04 DIAGNOSIS — Z20822 Contact with and (suspected) exposure to covid-19: Secondary | ICD-10-CM | POA: Diagnosis not present

## 2019-08-04 DIAGNOSIS — J069 Acute upper respiratory infection, unspecified: Secondary | ICD-10-CM

## 2019-08-04 NOTE — ED Provider Notes (Signed)
[facility]    CSN: [id number] Arrival date & time: 08/04/19  0911      History   Chief Complaint Chief Complaint  Patient presents with  . Cough    HPI Diamond Baker is a 49 y.o. female.   Patient developed a sore throat about 5 days ago, followed the next day by cough and sinus congestion.  She has had persistent myalgias, headache, sweats, nausea, and notes that her taste has changed.  She was recently started on doxycycline and a cough suppressant by her PCP.  She denies shortness of breath or pleuritic pain.  The history is provided by the patient.    Past Medical History:  Diagnosis Date  . GERD (gastroesophageal reflux disease)   . Hypertension     There are no problems to display for this patient.   History reviewed. No pertinent surgical history.  OB History    Gravida  1   Para      Term      Preterm      AB      Living        SAB      TAB      Ectopic      Multiple      Live Births               Home Medications    Prior to Admission medications   Medication Sig Start Date End Date Taking? Authorizing Provider  labetalol (NORMODYNE) 100 MG tablet Take 100 mg by mouth 2 (two) times daily.    [provider]  lisinopril (PRINIVIL,ZESTRIL) 20 MG tablet Take 20 mg by mouth daily.    [provider]  meloxicam (MOBIC) 7.5 MG tablet Take 15 mg by mouth daily.    [provider]  omeprazole (PRILOSEC) 40 MG capsule Take 40 mg by mouth daily.    [provider]    Family History No family history on file.  Social History Social History   Tobacco Use  . Smoking status: Never Smoker  . Smokeless tobacco: Never Used  Substance Use Topics  . Alcohol use: Yes  . Drug use: No     Allergies   Patient has no known allergies.   Review of Systems Review of Systems + sore throat + cough No pleuritic pain No wheezing + nasal congestion + post-nasal drainage No sinus  pain/pressure No itchy/red eyes No earache No hemoptysis No SOB ? fever, + chills/sweats + nausea No vomiting No abdominal pain No diarrhea No urinary symptoms No skin rash + fatigue + myalgias + headache Used OTC meds without relief   Physical Exam Triage Vital Signs ED Triage Vitals  Enc Vitals Group     BP 08/04/19 0933 123/90     Pulse Rate 08/04/19 0933 91     Resp --      Temp 08/04/19 0933 98.8 F (37.1 C)     Temp Source 08/04/19 0933 Oral     SpO2 08/04/19 0933 99 %     Weight 08/04/19 0934 160 lb (72.6 kg)     Height 08/04/19 0934 5\' 4"  (1.626 m)     Head Circumference --      Peak Flow --      Pain Score 08/04/19 0934 0     Pain Loc --      Pain Edu? --      Excl. in GC? --    No data found.  Updated Vital Signs BP 123/90 (BP Location: Right Arm)   Pulse 91   Temp 98.8 F (37.1 C) (Oral)   Ht 5\' 4"  (1.626 m)   Wt 72.6 kg   SpO2 99%   BMI 27.46 kg/m   Visual Acuity Right Eye Distance:   Left Eye Distance:   Bilateral Distance:    Right Eye Near:   Left Eye Near:    Bilateral Near:     Physical Exam Nursing notes and Vital Signs reviewed. Appearance:  Patient appears stated age, and in no acute distress Eyes:  Pupils are equal, round, and reactive to light and accomodation.  Extraocular movement is intact.  Conjunctivae are not inflamed  Ears:  Canals normal.  Tympanic membranes normal.  Nose:  Mildly congested turbinates.  No sinus tenderness.   Pharynx:  Normal Neck:  Supple.  No adenopathy  Lungs:  Clear to auscultation.  Breath sounds are equal.  Moving air well. Heart:  Regular rate and rhythm without murmurs, rubs, or gallops.  Abdomen:  Nontender without masses or hepatosplenomegaly.  Bowel sounds are present.  No CVA or flank tenderness.  Extremities:  No edema.  Skin:  No rash present.   UC Treatments / Results  Labs (all labs ordered are listed, but only abnormal results are displayed) Labs Reviewed  SARS-COV-2  RNA,(COVID-19) QUALITATIVE NAAT    EKG   Radiology No results found.  Procedures Procedures (including critical care time)  Medications Ordered in UC Medications - No data to display  Initial Impression / Assessment and Plan / UC Course  I have reviewed the triage vital signs and the nursing notes.  Pertinent labs & imaging results that were available during my care of the patient were reviewed by me and considered in my medical decision making (see chart for details).    Benign exam. COVID19 send out   Final Clinical Impressions(s) / UC Diagnoses   Final diagnoses:  Exposure to COVID-19 virus  Viral URI with cough     Discharge Instructions     Take plain guaifenesin (1200mg  extended release tabs such as Mucinex) twice daily, with plenty of water, for cough and congestion. Get adequate rest.   May use Afrin nasal spray (or generic oxymetazoline) each morning for about 5 days and then discontinue.  Also recommend using saline nasal spray several times daily and saline nasal irrigation (AYR is a common brand).  Use Flonase nasal spray each morning after using Afrin nasal spray and saline nasal irrigation. Try warm salt water gargles for sore throat.  Stop all antihistamines for now, and other non-prescription cough/cold preparations. May take Ibuprofen 200mg , 4 tabs every 8 hours with food for fever, body aches, etc. Continue doxycycline. Continue prescription cough medicine at bedtime.  Isolate yourself until COVID-19 test result is available.   If your COVID19 test is positive, then you are infected with the novel coronavirus and could give the virus to others.  Please continue isolation at home for at least 10 days since the start of your symptoms.  Once you complete your 10 day quarantine, you may return to normal activities as long as you've not had a fever for over 24 hours (without taking fever reducing medicine) and your symptoms are improving. Please continue good  preventive care measures, including:  frequent hand-washing, avoid touching your face, cover coughs/sneezes, stay out of crowds and keep a 6 foot distance from others.  Go to the nearest hospital emergency room if fever/cough/breathlessness are severe or illness  seems like a threat to life.     ED Prescriptions    None        Kandra Nicolas, MD 08/04/19 1006

## 2019-08-04 NOTE — Discharge Instructions (Addendum)
Take plain guaifenesin (1200mg  extended release tabs such as Mucinex) twice daily, with plenty of water, for cough and congestion. Get adequate rest.   May use Afrin nasal spray (or generic oxymetazoline) each morning for about 5 days and then discontinue.  Also recommend using saline nasal spray several times daily and saline nasal irrigation (AYR is a common brand).  Use Flonase nasal spray each morning after using Afrin nasal spray and saline nasal irrigation. Try warm salt water gargles for sore throat.  Stop all antihistamines for now, and other non-prescription cough/cold preparations. May take Ibuprofen 200mg , 4 tabs every 8 hours with food for fever, body aches, etc. Continue doxycycline. Continue prescription cough medicine at bedtime.  Isolate yourself until COVID-19 test result is available.   If your COVID19 test is positive, then you are infected with the novel coronavirus and could give the virus to others.  Please continue isolation at home for at least 10 days since the start of your symptoms.  Once you complete your 10 day quarantine, you may return to normal activities as long as you've not had a fever for over 24 hours (without taking fever reducing medicine) and your symptoms are improving. Please continue good preventive care measures, including:  frequent hand-washing, avoid touching your face, cover coughs/sneezes, stay out of crowds and keep a 6 foot distance from others.  Go to the nearest hospital emergency room if fever/cough/breathlessness are severe or illness seems like a threat to life.

## 2019-08-04 NOTE — ED Triage Notes (Signed)
Patient c/o productive cough x 3 days, body aches, HA's, sweating, sinus pressure, exposure to COVID at work last week.

## 2019-08-07 ENCOUNTER — Telehealth: Payer: Self-pay | Admitting: *Deleted

## 2019-08-07 LAB — SARS-COV-2 RNA,(COVID-19) QUALITATIVE NAAT: SARS CoV2 RNA: DETECTED — AB

## 2019-08-07 NOTE — Telephone Encounter (Signed)
Notified of COVID results.Work note written.

## 2020-02-12 ENCOUNTER — Other Ambulatory Visit: Payer: Self-pay

## 2020-02-12 ENCOUNTER — Emergency Department (HOSPITAL_BASED_OUTPATIENT_CLINIC_OR_DEPARTMENT_OTHER)
Admission: EM | Admit: 2020-02-12 | Discharge: 2020-02-12 | Disposition: A | Discharge status: Home / Self Care | Attending: Emergency Medicine | Admitting: Emergency Medicine

## 2020-02-12 ENCOUNTER — Encounter (HOSPITAL_BASED_OUTPATIENT_CLINIC_OR_DEPARTMENT_OTHER): Payer: Self-pay | Admitting: *Deleted

## 2020-02-12 DIAGNOSIS — R519 Headache, unspecified: Secondary | ICD-10-CM | POA: Diagnosis not present

## 2020-02-12 DIAGNOSIS — R1030 Lower abdominal pain, unspecified: Secondary | ICD-10-CM | POA: Insufficient documentation

## 2020-02-12 DIAGNOSIS — Z5321 Procedure and treatment not carried out due to patient leaving prior to being seen by health care provider: Secondary | ICD-10-CM | POA: Diagnosis not present

## 2020-02-12 DIAGNOSIS — R110 Nausea: Secondary | ICD-10-CM | POA: Insufficient documentation

## 2020-02-12 LAB — COMPREHENSIVE METABOLIC PANEL
ALT: 30 U/L (ref 0–44)
AST: 22 U/L (ref 15–41)
Albumin: 4 g/dL (ref 3.5–5.0)
Alkaline Phosphatase: 58 U/L (ref 38–126)
Anion gap: 10 (ref 5–15)
BUN: 13 mg/dL (ref 6–20)
CO2: 26 mmol/L (ref 22–32)
Calcium: 8.8 mg/dL — ABNORMAL LOW (ref 8.9–10.3)
Chloride: 102 mmol/L (ref 98–111)
Creatinine, Ser: 0.83 mg/dL (ref 0.44–1.00)
GFR calc Af Amer: >60 mL/min (ref >60)
GFR calc non Af Amer: >60 mL/min (ref >60)
Glucose, Bld: 108 mg/dL — ABNORMAL HIGH (ref 70–99)
Potassium: 4.2 mmol/L (ref 3.5–5.1)
Sodium: 138 mmol/L (ref 135–145)
Total Bilirubin: 0.5 mg/dL (ref 0.3–1.2)
Total Protein: 7.2 g/dL (ref 6.5–8.1)

## 2020-02-12 LAB — URINALYSIS, ROUTINE W REFLEX MICROSCOPIC
Bilirubin Urine: NEGATIVE
Glucose, UA: 100 mg/dL — AB
Hgb urine dipstick: NEGATIVE
Ketones, ur: NEGATIVE mg/dL
Leukocytes,Ua: NEGATIVE
Nitrite: NEGATIVE
Protein, ur: NEGATIVE mg/dL
Specific Gravity, Urine: 1.015 (ref 1.005–1.030)
pH: 6 (ref 5.0–8.0)

## 2020-02-12 LAB — CBC WITH DIFFERENTIAL/PLATELET
Abs Immature Granulocytes: 0.01 10*3/uL (ref 0.00–0.07)
Basophils Absolute: 0 10*3/uL (ref 0.0–0.1)
Basophils Relative: 0 %
Eosinophils Absolute: 0.1 10*3/uL (ref 0.0–0.5)
Eosinophils Relative: 1 %
HCT: 39.8 % (ref 36.0–46.0)
Hemoglobin: 13 g/dL (ref 12.0–15.0)
Immature Granulocytes: 0 %
Lymphocytes Relative: 25 %
Lymphs Abs: 1.1 10*3/uL (ref 0.7–4.0)
MCH: 28 pg (ref 26.0–34.0)
MCHC: 32.7 g/dL (ref 30.0–36.0)
MCV: 85.8 fL (ref 80.0–100.0)
Monocytes Absolute: 0.7 10*3/uL (ref 0.1–1.0)
Monocytes Relative: 15 %
Neutro Abs: 2.6 10*3/uL (ref 1.7–7.7)
Neutrophils Relative %: 59 %
Platelets: 221 10*3/uL (ref 150–400)
RBC: 4.64 MIL/uL (ref 3.87–5.11)
RDW: 14.5 % (ref 11.5–15.5)
WBC: 4.6 10*3/uL (ref 4.0–10.5)
nRBC: 0 % (ref 0.0–0.2)

## 2020-02-12 LAB — LIPASE, BLOOD: Lipase: 26 U/L (ref 11–51)

## 2020-02-12 LAB — PREGNANCY, URINE: Preg Test, Ur: NEGATIVE

## 2020-02-12 NOTE — ED Triage Notes (Signed)
Left lower abdominal pain into her left flank for a week.  Headache x 3 days. Nausea.

## 2020-02-12 NOTE — ED Notes (Signed)
Pt notified registration she was leaving and ambulated from the department

## 2021-04-11 ENCOUNTER — Other Ambulatory Visit: Payer: Self-pay

## 2021-04-11 ENCOUNTER — Emergency Department (INDEPENDENT_AMBULATORY_CARE_PROVIDER_SITE_OTHER)
Admission: EM | Admit: 2021-04-11 | Discharge: 2021-04-11 | Disposition: A | Discharge status: Home / Self Care | Source: Home / Self Care

## 2021-04-11 DIAGNOSIS — U071 COVID-19: Secondary | ICD-10-CM

## 2021-04-11 DIAGNOSIS — J029 Acute pharyngitis, unspecified: Secondary | ICD-10-CM | POA: Diagnosis not present

## 2021-04-11 DIAGNOSIS — R059 Cough, unspecified: Secondary | ICD-10-CM

## 2021-04-11 LAB — POC SARS CORONAVIRUS 2 AG -  ED: SARS Coronavirus 2 Ag: POSITIVE — AB

## 2021-04-11 MED ORDER — PREDNISONE 20 MG PO TABS
ORAL_TABLET | ORAL | 0 refills | Status: DC
Start: 2021-04-11 — End: 2021-06-23

## 2021-04-11 MED ORDER — MOLNUPIRAVIR EUA 200MG CAPSULE: 4.0000 | ORAL_CAPSULE | Freq: Two times a day (BID) | ORAL | 0 refills | Status: AC

## 2021-04-11 MED ORDER — BENZONATATE 200 MG PO CAPS: 200.0000 mg | ORAL_CAPSULE | Freq: Three times a day (TID) | ORAL | 0 refills | Status: AC | PRN

## 2021-04-11 MED ORDER — ACETAMINOPHEN 325 MG PO TABS
650.0000 mg | ORAL_TABLET | Freq: Once | ORAL | Status: AC
  Administered 2021-04-11: 650 mg via ORAL

## 2021-04-11 NOTE — ED Triage Notes (Signed)
Pt presents w/ complaints of cough and sore throat. Pt states sx began over the weekend and included body aches and ever that have since resolved. Pt states she is taking lozenges and cough syrup that have not been effective.

## 2021-04-11 NOTE — Discharge Instructions (Addendum)
Advised/instructed patient to self quarantine until Monday, 04/21/2021, if asymptomatic may self quarantine until Wednesday, 04/16/2021.  Advised/instructed patient to take medication as directed with food to completion.  Instructed patient to take currently prescribed blood pressure medication once home and to wait 1.5 hours prior to taking/start prednisone burst.  Encouraged patient increase daily water intake while taking these medications.

## 2021-04-11 NOTE — ED Notes (Signed)
BP rechecked by provider - see note  Pt to take BP med prior to taking prednisone as directed by M. Ave Filter, FNP

## 2021-04-11 NOTE — ED Provider Notes (Signed)
[facility]    CSN: [id number] Arrival date & time: 04/11/21  8546      History   Chief Complaint Chief Complaint  Patient presents with   Cough   Sore Throat    HPI Diamond Baker is a 50 y.o. female.   HPI 50 year old female presents with cough and sore throat for 4 to 5 days.  Additionally complains of body aches that have resolved somewhat.  Patient endorses not taking her blood pressure medication this morning as of yet.  Past Medical History:  Diagnosis Date   GERD (gastroesophageal reflux disease)    Hypertension     There are no problems to display for this patient.   Past Surgical History:  Procedure Laterality Date   CERVICAL SPINE SURGERY      OB History     Gravida  1   Para      Term      Preterm      AB      Living         SAB      IAB      Ectopic      Multiple      Live Births               Home Medications    Prior to Admission medications   Medication Sig Start Date End Date Taking? Authorizing Provider  benzonatate (TESSALON) 200 MG capsule Take 1 capsule (200 mg total) by mouth 3 (three) times daily as needed for up to 7 days for cough. 04/11/21 04/18/21 Yes Trevor Iha, FNP  molnupiravir EUA 200 mg CAPS Take 4 capsules (800 mg total) by mouth 2 (two) times daily for 5 days. 04/11/21 04/16/21 Yes Trevor Iha, FNP  predniSONE (DELTASONE) 20 MG tablet Take 3 tabs p.o. x 5 days. 04/11/21  Yes Trevor Iha, FNP  Vitamin D, Ergocalciferol, (DRISDOL) 1.25 MG (50000 UNIT) CAPS capsule Take 50,000 Units by mouth every 7 (seven) days.   Yes [provider]  labetalol (NORMODYNE) 100 MG tablet Take 100 mg by mouth 2 (two) times daily.    [provider]  lisinopril (PRINIVIL,ZESTRIL) 20 MG tablet Take 20 mg by mouth daily.    [provider]  meloxicam (MOBIC) 7.5 MG tablet Take 15 mg by mouth daily. Patient not taking: Reported on 04/11/2021    [provider]  omeprazole  (PRILOSEC) 40 MG capsule Take 40 mg by mouth daily.    [provider]    Family History Family History  Problem Relation Age of Onset   Hypertension Mother    Diabetes Father     Social History Social History   Tobacco Use   Smoking status: Never   Smokeless tobacco: Never  Substance Use Topics   Alcohol use: Not Currently   Drug use: Not Currently     Allergies   Patient has no known allergies.   Review of Systems Review of Systems  Constitutional:  Positive for chills, fatigue and fever.  HENT:  Positive for sore throat.   Respiratory:  Positive for cough.   All other systems reviewed and are negative.   Physical Exam Triage Vital Signs ED Triage Vitals  Enc Vitals Group     BP 04/11/21 0839 (!) 157/128     Pulse Rate 04/11/21 0834 81     Resp 04/11/21 0834 14     Temp 04/11/21 0834 99.4 F (37.4 C)     Temp Source 04/11/21  1610 Oral     SpO2 04/11/21 0834 99 %     Weight 04/11/21 0836 160 lb (72.6 kg)     Height 04/11/21 0836 5\' 4"  (1.626 m)     Head Circumference --      Peak Flow --      Pain Score 04/11/21 0836 9     Pain Loc --      Pain Edu? --      Excl. in GC? --    No data found.  Updated Vital Signs BP (!) 157/128 (BP Location: Right Arm)   Pulse 81   Temp 99.4 F (37.4 C) (Oral)   Resp 14   Ht 5\' 4"  (1.626 m)   Wt 160 lb (72.6 kg)   SpO2 99%   BMI 27.46 kg/m   Physical Exam Constitutional:      General: She is not in acute distress.    Appearance: Normal appearance. She is normal weight. She is ill-appearing.  HENT:     Head: Normocephalic and atraumatic.     Right Ear: Tympanic membrane, ear canal and external ear normal.     Left Ear: Tympanic membrane, ear canal and external ear normal.     Mouth/Throat:     Lips: Pink.     Mouth: Mucous membranes are moist.     Pharynx: Uvula midline. Pharyngeal swelling, posterior oropharyngeal erythema and uvula swelling present.  Eyes:     Extraocular Movements: Extraocular  movements intact.     Conjunctiva/sclera: Conjunctivae normal.     Pupils: Pupils are equal, round, and reactive to light.  Neck:     Comments: No JVD, no bruit Cardiovascular:     Rate and Rhythm: Normal rate and regular rhythm.     Pulses: Normal pulses.     Heart sounds: Normal heart sounds.  Pulmonary:     Effort: Pulmonary effort is normal.     Breath sounds: Normal breath sounds. No wheezing, rhonchi or rales.  Musculoskeletal:     Cervical back: Normal range of motion and neck supple. Tenderness present.  Lymphadenopathy:     Cervical: Cervical adenopathy present.  Skin:    General: Skin is warm and dry.  Neurological:     General: No focal deficit present.     Mental Status: She is alert and oriented to person, place, and time. Mental status is at baseline.  Psychiatric:        Mood and Affect: Mood normal.        Behavior: Behavior normal.        Thought Content: Thought content normal.    Second manual BP left seated 0925 130/104 by me UC Treatments / Results  Labs (all labs ordered are listed, but only abnormal results are displayed) Labs Reviewed  POC SARS CORONAVIRUS 2 AG -  ED - Abnormal; Notable for the following components:      Result Value   SARS Coronavirus 2 Ag Positive (*)    All other components within normal limits    EKG   Radiology No results found.  Procedures Procedures (including critical care time)  Medications Ordered in UC Medications  acetaminophen (TYLENOL) tablet 650 mg (650 mg Oral Given 04/11/21 0847)    Initial Impression / Assessment and Plan / UC Course  I have reviewed the triage vital signs and the nursing notes.  Pertinent labs & imaging results that were available during my care of the patient were reviewed by me and considered in my medical decision  making (see chart for details).     MDM: 1.  COVID-19-Rx'd Molnupiravir; 2. Cough-Rx'd prednisone burst, and Tessalon Perles; 3.  Acute pharyngitis-prednisone burst;  Advised/instructed patient to self quarantine until Monday, 04/21/2021, if asymptomatic may self quarantine until Wednesday, 04/16/2021.  Advised/instructed patient to take medication as directed with food to completion.  Instructed patient to take currently prescribed blood pressure medication once home and to wait 1.5 hours prior to taking/start prednisone burst.  Encouraged patient increase daily water intake while taking these medications.  Discharged home, hemodynamically stable. Final Clinical Impressions(s) / UC Diagnoses   Final diagnoses:  COVID-19  Cough  Acute pharyngitis, unspecified etiology     Discharge Instructions      Advised/instructed patient to self quarantine until Monday, 04/21/2021, if asymptomatic may self quarantine until Wednesday, 04/16/2021.  Advised/instructed patient to take medication as directed with food to completion.  Instructed patient to take currently prescribed blood pressure medication once home and to wait 1.5 hours prior to taking/start prednisone burst.  Encouraged patient increase daily water intake while taking these medications.     ED Prescriptions     Medication Sig Dispense Auth. Provider   predniSONE (DELTASONE) 20 MG tablet Take 3 tabs p.o. x 5 days. 15 tablet Trevor Iha, FNP   molnupiravir EUA 200 mg CAPS Take 4 capsules (800 mg total) by mouth 2 (two) times daily for 5 days. 40 capsule Trevor Iha, FNP   benzonatate (TESSALON) 200 MG capsule Take 1 capsule (200 mg total) by mouth 3 (three) times daily as needed for up to 7 days for cough. 30 capsule Trevor Iha, FNP      PDMP not reviewed this encounter.   Trevor Iha, FNP 04/11/21 [phone]

## 2021-06-23 ENCOUNTER — Other Ambulatory Visit: Payer: Self-pay

## 2021-06-23 ENCOUNTER — Emergency Department (INDEPENDENT_AMBULATORY_CARE_PROVIDER_SITE_OTHER)
Admission: EM | Admit: 2021-06-23 | Discharge: 2021-06-23 | Disposition: A | Discharge status: Home / Self Care | Source: Home / Self Care | Attending: Family Medicine | Admitting: Family Medicine

## 2021-06-23 DIAGNOSIS — R6889 Other general symptoms and signs: Secondary | ICD-10-CM | POA: Diagnosis not present

## 2021-06-23 DIAGNOSIS — J029 Acute pharyngitis, unspecified: Secondary | ICD-10-CM

## 2021-06-23 DIAGNOSIS — R0981 Nasal congestion: Secondary | ICD-10-CM

## 2021-06-23 DIAGNOSIS — I10 Essential (primary) hypertension: Secondary | ICD-10-CM

## 2021-06-23 LAB — POCT RAPID STREP A (OFFICE): Rapid Strep A Screen: NEGATIVE

## 2021-06-23 LAB — POC INFLUENZA A AND B ANTIGEN (URGENT CARE ONLY)
Influenza A Ag: NEGATIVE
Influenza B Ag: NEGATIVE

## 2021-06-23 NOTE — ED Triage Notes (Signed)
Pt presents with cough, congestion, body aches, sore throat and head ache that began this weekend

## 2021-06-23 NOTE — Discharge Instructions (Addendum)
Strep test is negative Flu tests are negative This is another respiratory virus Rest at home drink plenty of fluids.  Take over-the-counter cough and cold medicine as needed Call if not improving in a few days Follow-up with your PCP for elevated blood pressure

## 2021-06-23 NOTE — ED Provider Notes (Signed)
[facility]    CSN: [id number] Arrival date & time: 06/23/21  1259      History   Chief Complaint Chief Complaint  Patient presents with   Generalized Body Aches   Sore Throat   Cough   Nasal Congestion   Headache    HPI Diamond Baker is a 50 y.o. female.   HPI Patient is here for an upper respiratory infection.  She states she had cold symptoms for about 3 days.  She has had some body aches sore throat coughing nasal congestion and headache.  She has a sore throat.  She is here for testing.  She works at [employer] and needs a note  Past Medical History:  Diagnosis Date   GERD (gastroesophageal reflux disease)    Hypertension     There are no problems to display for this patient.   Past Surgical History:  Procedure Laterality Date   CERVICAL SPINE SURGERY      OB History     Gravida  1   Para      Term      Preterm      AB      Living         SAB      IAB      Ectopic      Multiple      Live Births               Home Medications    Prior to Admission medications   Medication Sig Start Date End Date Taking? Authorizing Provider  labetalol (NORMODYNE) 100 MG tablet Take 100 mg by mouth 2 (two) times daily.    [provider]  lisinopril (PRINIVIL,ZESTRIL) 20 MG tablet Take 20 mg by mouth daily.    [provider]  omeprazole (PRILOSEC) 40 MG capsule Take 40 mg by mouth daily.    [provider]  Vitamin D, Ergocalciferol, (DRISDOL) 1.25 MG (50000 UNIT) CAPS capsule Take 50,000 Units by mouth every 7 (seven) days.    [provider]    Family History Family History  Problem Relation Age of Onset   Hypertension Mother    Diabetes Father     Social History Social History   Tobacco Use   Smoking status: Never   Smokeless tobacco: Never  Substance Use Topics   Alcohol use: Not Currently   Drug use: Not Currently     Allergies   Patient has no known allergies.   Review  of Systems Review of Systems   Physical Exam Triage Vital Signs ED Triage Vitals [06/23/21 1313]  Enc Vitals Group     BP (!) 159/113     Pulse Rate (!) 105     Resp 14     Temp 99 F (37.2 C)     Temp Source Oral     SpO2 96 %     Weight      Height      Head Circumference      Peak Flow      Pain Score 4     Pain Loc      Pain Edu?      Excl. in GC?    No data found.  Updated Vital Signs BP (!) 159/113 (BP Location: Right Arm)   Pulse (!) 105   Temp 99 F (37.2 C) (Oral)   Resp 14   SpO2 96%   Visual Acuity Right Eye Distance:   Left Eye Distance:  Bilateral Distance:    Right Eye Near:   Left Eye Near:    Bilateral Near:     Physical Exam Constitutional:      General: She is not in acute distress.    Appearance: She is well-developed. She is ill-appearing.  HENT:     Head: Normocephalic and atraumatic.     Right Ear: Tympanic membrane, ear canal and external ear normal.     Left Ear: Tympanic membrane, ear canal and external ear normal.     Nose: Congestion and rhinorrhea present.     Mouth/Throat:     Pharynx: Posterior oropharyngeal erythema present.     Comments: Tonsils large.  No exudate Eyes:     Conjunctiva/sclera: Conjunctivae normal.     Pupils: Pupils are equal, round, and reactive to light.  Cardiovascular:     Rate and Rhythm: Normal rate and regular rhythm.     Heart sounds: Normal heart sounds.  Pulmonary:     Effort: Pulmonary effort is normal. No respiratory distress.     Breath sounds: Normal breath sounds.  Abdominal:     General: There is no distension.     Palpations: Abdomen is soft.  Musculoskeletal:        General: Normal range of motion.     Cervical back: Normal range of motion.  Lymphadenopathy:     Cervical: No cervical adenopathy.  Skin:    General: Skin is warm and dry.  Neurological:     Mental Status: She is alert.     UC Treatments / Results  Labs (all labs ordered are listed, but only abnormal  results are displayed) Labs Reviewed  POC INFLUENZA A AND B ANTIGEN (URGENT CARE ONLY) - Normal  CULTURE, GROUP A STREP  POCT RAPID STREP A (OFFICE)    EKG   Radiology No results found.  Procedures Procedures (including critical care time)  Medications Ordered in UC Medications - No data to display  Initial Impression / Assessment and Plan / UC Course  I have reviewed the triage vital signs and the nursing notes.  Pertinent labs & imaging results that were available during my care of the patient were reviewed by me and considered in my medical decision making (see chart for details).     Flu testing negative, strep testing negative.  COVID testing is not done.  Patient was told this is an upper respiratory virus and needs symptomatic management.  Note is given for work. Final Clinical Impressions(s) / UC Diagnoses   Final diagnoses:  Flu-like symptoms  Elevated blood pressure reading with diagnosis of hypertension     Discharge Instructions      Strep test is negative Flu tests are negative This is another respiratory virus Rest at home drink plenty of fluids.  Take over-the-counter cough and cold medicine as needed Call if not improving in a few days Follow-up with your PCP for elevated blood pressure   ED Prescriptions   None    PDMP not reviewed this encounter.   Eustace Moore, MD 06/23/21 [phone]

## 2021-06-26 LAB — CULTURE, GROUP A STREP: Strep A Culture: NEGATIVE

## 2021-10-01 ENCOUNTER — Encounter: Payer: Self-pay | Admitting: Family Medicine

## 2021-10-01 ENCOUNTER — Other Ambulatory Visit: Payer: Self-pay

## 2021-10-01 ENCOUNTER — Emergency Department (INDEPENDENT_AMBULATORY_CARE_PROVIDER_SITE_OTHER)
Admission: EM | Admit: 2021-10-01 | Discharge: 2021-10-01 | Disposition: A | Discharge status: Home / Self Care | Source: Home / Self Care

## 2021-10-01 DIAGNOSIS — J039 Acute tonsillitis, unspecified: Secondary | ICD-10-CM

## 2021-10-01 LAB — POCT RAPID STREP A (OFFICE): Rapid Strep A Screen: NEGATIVE

## 2021-10-01 MED ORDER — PREDNISONE 20 MG PO TABS: ORAL_TABLET | ORAL | 0 refills | Status: AC

## 2021-10-01 MED ORDER — AMOXICILLIN-POT CLAVULANATE 875-125 MG PO TABS: 1.0000 | ORAL_TABLET | Freq: Two times a day (BID) | ORAL | 0 refills | Status: AC

## 2021-10-01 NOTE — ED Triage Notes (Signed)
Pt states that her throat is sore and swollen with white patches. X3 days ? ?Pt states that she has chills as well. ?Pt states that she isn't vaccinated for covid. ?Pt states that she hasn't had flu vaccine. ?

## 2021-10-01 NOTE — ED Provider Notes (Signed)
?[facility] ? ? ? ?CSN: [id number] ?Arrival date & time: 10/01/21  1548 ? ? ?  ? ?History   ?Chief Complaint ?Chief Complaint  ?Patient presents with  ? Sore Throat  ?  Sore throat and chills. X3 days  ? ? ?HPI ?Diamond Baker is a 51 y.o. female.  ? ?HPI 51 year old female presents with sore throat with white patches and chills for 3 days.  PMH significant for HTN and GERD.  Patient reports not taking her Labetalol or Lisinopril today. ? ?Past Medical History:  ?Diagnosis Date  ? GERD (gastroesophageal reflux disease)   ? Hypertension   ? ? ?There are no problems to display for this patient. ? ? ?Past Surgical History:  ?Procedure Laterality Date  ? CERVICAL SPINE SURGERY    ? ? ?OB History   ? ? Gravida  ?1  ? Para  ?   ? Term  ?   ? Preterm  ?   ? AB  ?   ? Living  ?   ?  ? ? SAB  ?   ? IAB  ?   ? Ectopic  ?   ? Multiple  ?   ? Live Births  ?   ?   ?  ?  ? ? ? ?Home Medications   ? ?Prior to Admission medications   ?Medication Sig Start Date End Date Taking? Authorizing Provider  ?amoxicillin-clavulanate (AUGMENTIN) 875-125 MG tablet Take 1 tablet by mouth every 12 (twelve) hours. 10/01/21  Yes Trevor Iha, FNP  ?labetalol (NORMODYNE) 100 MG tablet Take 100 mg by mouth 2 (two) times daily.   Yes [provider]  ?lisinopril (PRINIVIL,ZESTRIL) 20 MG tablet Take 20 mg by mouth daily.   Yes [provider]  ?omeprazole (PRILOSEC) 40 MG capsule Take 40 mg by mouth daily.   Yes [provider]  ?predniSONE (DELTASONE) 20 MG tablet Take 3 tabs PO daily x 5 days. 10/01/21  Yes Trevor Iha, FNP  ?Vitamin D, Ergocalciferol, (DRISDOL) 1.25 MG (50000 UNIT) CAPS capsule Take 50,000 Units by mouth every 7 (seven) days.   Yes [provider]  ? ? ?Family History ?Family History  ?Problem Relation Age of Onset  ? Hypertension Mother   ? Diabetes Father   ? ? ?Social History ?Social History  ? ?Tobacco Use  ? Smoking status: Never  ? Smokeless tobacco: Never  ?Substance Use  Topics  ? Alcohol use: Yes  ?  Comment: occ  ? Drug use: Not Currently  ? ? ? ?Allergies   ?Patient has no known allergies. ? ? ?Review of Systems ?Review of Systems  ?Constitutional:  Positive for chills.  ?HENT:  Positive for sore throat.   ?All other systems reviewed and are negative. ? ? ?Physical Exam ?Triage Vital Signs ?ED Triage Vitals  ?Enc Vitals Group  ?   BP   ?   Pulse   ?   Resp   ?   Temp   ?   Temp src   ?   SpO2   ?   Weight   ?   Height   ?   Head Circumference   ?   Peak Flow   ?   Pain Score   ?   Pain Loc   ?   Pain Edu?   ?   Excl. in GC?   ? ?No data found. ? ?Updated Vital Signs ?BP (!) 148/80 (BP Location: Left Arm)   Pulse Marland Kitchen)  101   Temp 98.9 ?F (37.2 ?C) (Oral)   Resp 18   Ht 5\' 4"  (1.626 m)   Wt 162 lb (73.5 kg)   SpO2 99%   BMI 27.81 kg/m?  ? ?  ? ?Physical Exam ?Vitals and nursing note reviewed.  ?Constitutional:   ?   General: She is not in acute distress. ?   Appearance: She is well-developed and normal weight. She is not ill-appearing.  ?HENT:  ?   Head: Normocephalic and atraumatic.  ?   Right Ear: Tympanic membrane, ear canal and external ear normal.  ?   Left Ear: Tympanic membrane, ear canal and external ear normal.  ?   Mouth/Throat:  ?   Mouth: Mucous membranes are moist.  ?   Pharynx: Oropharynx is clear. Uvula midline. Posterior oropharyngeal erythema and uvula swelling present.  ?   Tonsils: Tonsillar exudate present. 3+ on the right. 3+ on the left.  ?Eyes:  ?   Conjunctiva/sclera: Conjunctivae normal.  ?   Pupils: Pupils are equal, round, and reactive to light.  ?Cardiovascular:  ?   Rate and Rhythm: Normal rate and regular rhythm.  ?   Pulses: Normal pulses.  ?   Heart sounds: Normal heart sounds.  ?Pulmonary:  ?   Effort: Pulmonary effort is normal.  ?   Breath sounds: Normal breath sounds.  ?Musculoskeletal:  ?   Cervical back: Normal range of motion and neck supple. Tenderness present.  ?Lymphadenopathy:  ?   Cervical: Cervical adenopathy present.  ?Skin: ?    General: Skin is warm and dry.  ?Neurological:  ?   General: No focal deficit present.  ?   Mental Status: She is alert and oriented to person, place, and time.  ? ? ? ?UC Treatments / Results  ?Labs ?(all labs ordered are listed, but only abnormal results are displayed) ?Labs Reviewed  ?POCT RAPID STREP A (OFFICE) - Normal  ? ? ?EKG ? ? ?Radiology ?No results found. ? ?Procedures ?Procedures (including critical care time) ? ?Medications Ordered in UC ?Medications - No data to display ? ?Initial Impression / Assessment and Plan / UC Course  ?I have reviewed the triage vital signs and the nursing notes. ? ?Pertinent labs & imaging results that were available during my care of the patient were reviewed by me and considered in my medical decision making (see chart for details). ? ?  ? ?MDM: 1.  Tonsillitis-Rx'd Augmentin and Prednisone. Advised patient to take medication as directed with food to completion.  Advised patient to take Prednisone with first dose of Augmentin for the next 5 of 7 days.  Encouraged patient to increase daily water intake while taking these medications.  Work note provided to patient prior to discharge.  Patient discharged home, hemodynamically stable. ?Final Clinical Impressions(s) / UC Diagnoses  ? ?Final diagnoses:  ?Acute tonsillitis, unspecified etiology  ? ? ? ?Discharge Instructions   ? ?  ?Advised patient to take medication as directed with food to completion.  Advised patient to take Prednisone with first dose of Augmentin for the next 5 of 7 days.  Encouraged patient to increase daily water intake while taking these medications. ? ? ? ? ?ED Prescriptions   ? ? Medication Sig Dispense Auth. Provider  ? amoxicillin-clavulanate (AUGMENTIN) 875-125 MG tablet Take 1 tablet by mouth every 12 (twelve) hours. 14 tablet , FNP  ? predniSONE (DELTASONE) 20 MG tablet Take 3 tabs PO daily x 5 days. 15 tablet Trevor Iha, FNP  ? ?  ? ?  PDMP not reviewed this encounter. ?  ?Trevor Iha, FNP ?10/01/21 1634 ? ?

## 2021-10-01 NOTE — Discharge Instructions (Addendum)
Advised patient to take medication as directed with food to completion.  Advised patient to take Prednisone with first dose of Augmentin for the next 5 of 7 days.  Encouraged patient to increase daily water intake while taking these medications. °
